# Patient Record
Sex: Female | Born: 1990 | Race: White | Hispanic: No | Marital: Single | Smoking: Never smoker
Health system: Southern US, Community
[De-identification: ages and names within clinical notes are randomized; demographics above are authoritative.]

## PROBLEM LIST (undated history)

## (undated) ENCOUNTER — Inpatient Hospital Stay (HOSPITAL_COMMUNITY): Payer: Self-pay

## (undated) ENCOUNTER — Emergency Department (HOSPITAL_COMMUNITY): Admission: EM | Payer: Medicaid Other | Source: Home / Self Care

## (undated) HISTORY — PX: WISDOM TOOTH EXTRACTION: SHX21

---
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
## 2012-02-10 ENCOUNTER — Emergency Department (HOSPITAL_COMMUNITY)
Admission: EM | Admit: 2012-02-10 | Discharge: 2012-02-11 | Disposition: A | Discharge status: Home / Self Care | Payer: Medicaid Other | Attending: Emergency Medicine | Admitting: Emergency Medicine

## 2012-02-10 ENCOUNTER — Encounter (HOSPITAL_COMMUNITY): Payer: Self-pay | Admitting: Emergency Medicine

## 2012-02-10 DIAGNOSIS — R071 Chest pain on breathing: Secondary | ICD-10-CM | POA: Insufficient documentation

## 2012-02-10 DIAGNOSIS — M940 Chondrocostal junction syndrome [Tietze]: Secondary | ICD-10-CM | POA: Insufficient documentation

## 2012-02-10 DIAGNOSIS — M545 Low back pain, unspecified: Secondary | ICD-10-CM | POA: Insufficient documentation

## 2012-02-10 DIAGNOSIS — R599 Enlarged lymph nodes, unspecified: Secondary | ICD-10-CM | POA: Insufficient documentation

## 2012-02-10 DIAGNOSIS — IMO0001 Reserved for inherently not codable concepts without codable children: Secondary | ICD-10-CM | POA: Insufficient documentation

## 2012-02-10 DIAGNOSIS — J020 Streptococcal pharyngitis: Secondary | ICD-10-CM

## 2012-02-10 LAB — URINALYSIS, ROUTINE W REFLEX MICROSCOPIC
Bilirubin Urine: NEGATIVE
Glucose, UA: NEGATIVE mg/dL
Hgb urine dipstick: NEGATIVE
Ketones, ur: NEGATIVE mg/dL
Leukocytes, UA: NEGATIVE
Nitrite: NEGATIVE
Protein, ur: NEGATIVE mg/dL
Specific Gravity, Urine: 1.025 (ref 1.005–1.030)
Urobilinogen, UA: 1 mg/dL (ref 0.0–1.0)
pH: 6 (ref 5.0–8.0)

## 2012-02-10 LAB — POCT PREGNANCY, URINE: Preg Test, Ur: NEGATIVE

## 2012-02-10 MED ORDER — DEXAMETHASONE SODIUM PHOSPHATE 10 MG/ML IJ SOLN
10.0000 mg | Freq: Once | INTRAMUSCULAR | Status: AC
  Administered 2012-02-10: 10 mg via INTRAMUSCULAR
  Filled 2012-02-10: qty 1

## 2012-02-10 MED ORDER — PENICILLIN G BENZATHINE 1200000 UNIT/2ML IM SUSP
1.2000 10*6.[IU] | Freq: Once | INTRAMUSCULAR | Status: AC
  Administered 2012-02-10: 1.2 10*6.[IU] via INTRAMUSCULAR
  Filled 2012-02-10: qty 2

## 2012-02-10 NOTE — ED Notes (Signed)
Pt c/o sore throat and generalized aches

## 2012-02-10 NOTE — ED Provider Notes (Signed)
History     CSN: [id number]  Arrival date & time 02/10/12  2120   First MD Initiated Contact with Patient 02/10/12 2240      Chief Complaint  Patient presents with  . Sore Throat    (Consider location/radiation/quality/duration/timing/severity/associated sxs/prior treatment) HPI Comments: Patient here with sore throat, generalized aches and chest pain with lower back pain for the past several days - states fever yesterday but none noted today - denies nausea, vomiting, reports pain to bilateral upper chest wall - pain worse with palpation of chest wall - states lower back pain as well.  Patient is a 21 y.o. female presenting with pharyngitis. The history is provided by the patient. No language interpreter was used.  Sore Throat This is a new problem. The current episode started yesterday. The problem occurs constantly. The problem has been unchanged. Associated symptoms include chest pain, a fever, myalgias, a sore throat and swollen glands. Pertinent negatives include no abdominal pain, anorexia, arthralgias, change in bowel habit, chills, congestion, coughing, diaphoresis, fatigue, headaches, joint swelling, nausea, neck pain, numbness, rash, urinary symptoms, vertigo, visual change, vomiting or weakness. The symptoms are aggravated by swallowing. She has tried nothing for the symptoms. The treatment provided no relief.    History reviewed. No pertinent past medical history.  History reviewed. No pertinent past surgical history.  History reviewed. No pertinent family history.  History  Substance Use Topics  . Smoking status: Not on file  . Smokeless tobacco: Not on file  . Alcohol Use: Not on file    OB History    Grav Para Term Preterm Abortions TAB SAB Ect Mult Living                  Review of Systems  Constitutional: Positive for fever. Negative for chills, diaphoresis and fatigue.  HENT: Positive for sore throat. Negative for congestion and neck pain.   Respiratory:  Negative for cough.   Cardiovascular: Positive for chest pain.  Gastrointestinal: Negative for nausea, vomiting, abdominal pain, anorexia and change in bowel habit.  Musculoskeletal: Positive for myalgias. Negative for joint swelling and arthralgias.  Skin: Negative for rash.  Neurological: Negative for vertigo, weakness, numbness and headaches.  All other systems reviewed and are negative.    Allergies  Review of patient's allergies indicates no known allergies.  Home Medications   Current Outpatient Rx  Name Route Sig Dispense Refill  . ACETAMINOPHEN 500 MG PO TABS Oral Take 1,000 mg by mouth every 6 (six) hours as needed. For pain      BP 135/89  Pulse 94  Temp(Src) 98.3 F (36.8 C) (Oral)  Resp 18  SpO2 100%  LMP 01/21/2012  Physical Exam  Nursing note and vitals reviewed. Constitutional: She is oriented to person, place, and time. She appears well-developed and well-nourished. No distress.  HENT:  Head: Normocephalic and atraumatic.  Right Ear: External ear normal.  Left Ear: External ear normal.  Mouth/Throat: Oropharyngeal exudate present.  Eyes: Conjunctivae are normal. Pupils are equal, round, and reactive to light. No scleral icterus.  Neck: Normal range of motion. Neck supple.       Anterior cervical adenopathy  Cardiovascular: Normal rate, regular rhythm and normal heart sounds.  Exam reveals no gallop and no friction rub.   No murmur heard. Pulmonary/Chest: Effort normal and breath sounds normal. No respiratory distress. She exhibits tenderness.    Abdominal: Soft. Bowel sounds are normal. She exhibits no distension. There is no tenderness.  Musculoskeletal: Normal range of  motion. She exhibits no edema and no tenderness.  Lymphadenopathy:    She has cervical adenopathy.  Neurological: She is alert and oriented to person, place, and time. No cranial nerve deficit.  Skin: Skin is warm and dry. No rash noted. No erythema. No pallor.  Psychiatric: She has a  normal mood and affect. Her behavior is normal. Judgment and thought content normal.    ED Course  Procedures (including critical care time)   Labs Reviewed  URINALYSIS, ROUTINE W REFLEX MICROSCOPIC  POCT PREGNANCY, URINE   No results found.   Results for orders placed during the hospital encounter of 02/10/12  URINALYSIS, ROUTINE W REFLEX MICROSCOPIC      Component Value Range   Color, Urine YELLOW  YELLOW    APPearance CLEAR  CLEAR    Specific Gravity, Urine 1.025  1.005 - 1.030    pH 6.0  5.0 - 8.0    Glucose, UA NEGATIVE  NEGATIVE (mg/dL)   Hgb urine dipstick NEGATIVE  NEGATIVE    Bilirubin Urine NEGATIVE  NEGATIVE    Ketones, ur NEGATIVE  NEGATIVE (mg/dL)   Protein, ur NEGATIVE  NEGATIVE (mg/dL)   Urobilinogen, UA 1.0  0.0 - 1.0 (mg/dL)   Nitrite NEGATIVE  NEGATIVE    Leukocytes, UA NEGATIVE  NEGATIVE   POCT PREGNANCY, URINE      Component Value Range   Preg Test, Ur NEGATIVE  NEGATIVE    No results found.  Strep pharyngitis costochondritis    MDM  Patient with CENTOR score of 4 - will treat for strep - also with bilateral costochondral margin pain with palpation - denies pain with insipration, doubt PE or cardiac source given the infection - has a history of periodic chronic lower back pain - this is likely related to this.        Chelsea Price, [place] 02/11/12 0000

## 2012-02-11 MED ORDER — IBUPROFEN 600 MG PO TABS
600.0000 mg | ORAL_TABLET | Freq: Four times a day (QID) | ORAL | Status: AC | PRN
Start: 2012-02-11 — End: 2012-02-21

## 2012-02-11 NOTE — ED Provider Notes (Signed)
Medical screening examination/treatment/procedure(s) were performed by non-physician practitioner and as supervising physician I was immediately available for consultation/collaboration.   Rolan Bucco, MD 02/11/12 [phone]

## 2012-02-11 NOTE — Discharge Instructions (Signed)
Costochondritis Costochondritis is a condition in which the tissue (cartilage) that connects your ribs with your breastbone (sternum) becomes irritated and causes chest pain.  HOME CARE  Avoid activities that wear you out.   Do not strain your ribs. Avoid activities that use your:   Chest.   Belly.   Side muscles.   Put ice on the area.   Put ice in a plastic bag.   Place a towel betwen your skin and the bag.   Leave the ice on for 15 to 20 minutes, 3 to 4 times a day.   Only take medicine as told by your doctor.  GET HELP RIGHT AWAY IF:   Your pain gets worse.   You are very uncomfortable.   You have a fever.   You have trouble breathing.   You cough up blood.   You start sweating or throwing up (vomiting).   You develop new, unexplained symptoms.  MAKE SURE YOU:   Understand these instructions.   Will watch your condition.   Will get help right away if you are not doing well or get worse.  Document Released: 04/24/2008 Document Revised: 10/26/2011 Document Reviewed: 04/24/2008 [facility] Patient Information 2012 [place], [place].Strep Throat Strep throat is an infection of the throat caused by a bacteria named Streptococcus pyogenes. Your caregiver may call the infection streptococcal "tonsillitis" or "pharyngitis" depending on whether there are signs of inflammation in the tonsils or back of the throat. Strep throat is most common in children from 40 to 76 years old during the cold months of the year, but it can occur in people of any age during any season. This infection is spread from person to person (contagious) through coughing, sneezing, or other close contact. SYMPTOMS   Fever or chills.   Painful, swollen, red tonsils or throat.   Pain or difficulty when swallowing.   White or yellow spots on the tonsils or throat.   Swollen, tender lymph nodes or "glands" of the neck or under the jaw.   Red rash all over the body (rare).  DIAGNOSIS  Many different  infections can cause the same symptoms. A test must be done to confirm the diagnosis so the right treatment can be given. A "rapid strep test" can help your caregiver make the diagnosis in a few minutes. If this test is not available, a light swab of the infected area can be used for a throat culture test. If a throat culture test is done, results are usually available in a day or two. TREATMENT  Strep throat is treated with antibiotic medicine. HOME CARE INSTRUCTIONS   Gargle with 1 tsp of salt in 1 cup of warm water, 3 to 4 times per day or as needed for comfort.   Family members who also have a sore throat or fever should be tested for strep throat and treated with antibiotics if they have the strep infection.   Make sure everyone in your household washes their hands well.   Do not share food, drinking cups, or personal items that could cause the infection to spread to others.   You may need to eat a soft food diet until your sore throat gets better.   Drink enough water and fluids to keep your urine clear or pale yellow. This will help prevent dehydration.   Get plenty of rest.   Stay home from school, daycare, or work until you have been on antibiotics for 24 hours.   Only take over-the-counter or prescription medicines  for pain, discomfort, or fever as directed by your caregiver.   If antibiotics are prescribed, take them as directed. Finish them even if you start to feel better.  SEEK MEDICAL CARE IF:   The glands in your neck continue to enlarge.   You develop a rash, cough, or earache.   You cough up green, yellow-brown, or bloody sputum.   You have pain or discomfort not controlled by medicines.   Your problems seem to be getting worse rather than better.  SEEK IMMEDIATE MEDICAL CARE IF:   You develop any new symptoms such as vomiting, severe headache, stiff or painful neck, chest pain, shortness of breath, or trouble swallowing.   You develop severe throat pain,  drooling, or changes in your voice.   You develop swelling of the neck, or the skin on the neck becomes red and tender.   You have a fever.   You develop signs of dehydration, such as fatigue, dry mouth, and decreased urination.   You become increasingly sleepy, or you cannot wake up completely.  Document Released: 11/03/2000 Document Revised: 10/26/2011 Document Reviewed: 01/05/2011 [facility] Patient Information 2012 [place], [place].Salt Water Gargle This solution will help make your mouth and throat feel better. HOME CARE INSTRUCTIONS   Mix 1 teaspoon of salt in 8 ounces of warm water.   Gargle with this solution as much or often as you need or as directed. Swish and gargle gently if you have any sores or wounds in your mouth.   Do not swallow this mixture.  Document Released: 08/10/2004 Document Revised: 10/26/2011 Document Reviewed: 01/01/2009 [facility] Patient Information 2012 [place], [place].

## 2012-03-24 ENCOUNTER — Inpatient Hospital Stay (HOSPITAL_COMMUNITY)
Admission: AD | Admit: 2012-03-24 | Discharge: 2012-03-24 | Disposition: A | Discharge status: Home / Self Care | Payer: Medicaid Other | Source: Ambulatory Visit | Attending: Obstetrics & Gynecology | Admitting: Obstetrics & Gynecology

## 2012-03-24 ENCOUNTER — Encounter (HOSPITAL_COMMUNITY): Payer: Self-pay | Admitting: *Deleted

## 2012-03-24 ENCOUNTER — Inpatient Hospital Stay (HOSPITAL_COMMUNITY): Payer: Medicaid Other

## 2012-03-24 DIAGNOSIS — O468X1 Other antepartum hemorrhage, first trimester: Secondary | ICD-10-CM

## 2012-03-24 DIAGNOSIS — O209 Hemorrhage in early pregnancy, unspecified: Secondary | ICD-10-CM | POA: Insufficient documentation

## 2012-03-24 LAB — WET PREP, GENITAL: Yeast Wet Prep HPF POC: NONE SEEN

## 2012-03-24 LAB — URINE MICROSCOPIC-ADD ON

## 2012-03-24 LAB — CBC
HCT: 36.3 % (ref 36.0–46.0)
MCV: 83.1 fL (ref 78.0–100.0)
Platelets: 298 10*3/uL (ref 150–400)
RBC: 4.37 MIL/uL (ref 3.87–5.11)
RDW: 14.1 % (ref 11.5–15.5)
WBC: 10.1 10*3/uL (ref 4.0–10.5)

## 2012-03-24 LAB — HCG, QUANTITATIVE, PREGNANCY: hCG, Beta Chain, Quant, S: 23630 m[IU]/mL — ABNORMAL HIGH (ref <5)

## 2012-03-24 LAB — DIFFERENTIAL
Basophils Absolute: 0 10*3/uL (ref 0.0–0.1)
Eosinophils Relative: 1 % (ref 0–5)
Lymphocytes Relative: 29 % (ref 12–46)
Lymphs Abs: 2.9 10*3/uL (ref 0.7–4.0)
Monocytes Absolute: 0.7 10*3/uL (ref 0.1–1.0)
Neutro Abs: 6.4 10*3/uL (ref 1.7–7.7)

## 2012-03-24 LAB — URINALYSIS, ROUTINE W REFLEX MICROSCOPIC
Bilirubin Urine: NEGATIVE
Ketones, ur: NEGATIVE mg/dL
Leukocytes, UA: NEGATIVE
Nitrite: NEGATIVE
Protein, ur: NEGATIVE mg/dL
pH: 6 (ref 5.0–8.0)

## 2012-03-24 MED ORDER — RHO D IMMUNE GLOBULIN 1500 UNIT/2ML IJ SOLN
300.0000 ug | Freq: Once | INTRAMUSCULAR | Status: AC
  Administered 2012-03-24: 300 ug via INTRAMUSCULAR
  Filled 2012-03-24: qty 2

## 2012-03-24 NOTE — MAU Provider Note (Signed)
History     CSN: [id number]  Arrival date & time 03/24/12  1859   None     Chief Complaint  Patient presents with  . Vaginal Bleeding    HPI Chelsea Price is a 21 y.o. female who presents to MAU for bleeding in early pregnancy. Went to the health department on April 24th and had positive pregnancy test. LMP 02/11/12. Bleeding started 2 days ago as spotting and then stopped. Today began bleeding again also had low abdominal pain today. The history was provided by the patient.  History reviewed. No pertinent past medical history.  Past Surgical History  Procedure Date  . Wisdom tooth extraction     History reviewed. No pertinent family history.  History  Substance Use Topics  . Smoking status: Never Smoker   . Smokeless tobacco: Not on file  . Alcohol Use: No    OB History    Grav Para Term Preterm Abortions TAB SAB Ect Mult Living   1               Review of Systems  Constitutional: Negative for fever, chills, diaphoresis and fatigue.  HENT: Negative for ear pain, congestion, sore throat, facial swelling, neck pain, neck stiffness, dental problem and sinus pressure.   Eyes: Negative for photophobia, pain and discharge.  Respiratory: Negative for cough, chest tightness and wheezing.   Cardiovascular: Negative for chest pain and palpitations.  Gastrointestinal: Negative for nausea, vomiting, abdominal pain, diarrhea, constipation and abdominal distention.  Genitourinary: Positive for frequency and vaginal bleeding. Negative for dysuria, urgency, flank pain, vaginal discharge and difficulty urinating.  Musculoskeletal: Negative for myalgias, back pain and gait problem.  Skin: Negative for color change and rash.  Neurological: Negative for dizziness, speech difficulty, weakness, light-headedness, numbness and headaches.  Psychiatric/Behavioral: Negative for confusion and agitation. The patient is not nervous/anxious.     Allergies  Latex  Home Medications  No current  outpatient prescriptions on file.  BP 134/69  Pulse 105  Temp(Src) 98.2 F (36.8 C) (Oral)  Resp 20  Ht 5\' 2"  (1.575 m)  Wt 156 lb (70.761 kg)  BMI 28.53 kg/m2  SpO2 98%  LMP 02/11/2012  Physical Exam  Nursing note and vitals reviewed. Constitutional: She is oriented to person, place, and time. She appears well-developed and well-nourished. No distress.  HENT:  Head: Normocephalic.  Eyes: EOM are normal.  Neck: Neck supple.  Cardiovascular: Normal rate.   Pulmonary/Chest: Effort normal.  Genitourinary: Uterus normal. Vaginal discharge (small amount of blood in vault, cervix closed/long, Uterus small, adnexae nontender) found.  Musculoskeletal: Normal range of motion.  Neurological: She is alert and oriented to person, place, and time. No cranial nerve deficit.  Skin: Skin is warm and dry.  Psychiatric: She has a normal mood and affect. Her behavior is normal. Judgment and thought content normal.   Results for orders placed during the hospital encounter of 03/24/12 (from the past 24 hour(s))  URINALYSIS, ROUTINE W REFLEX MICROSCOPIC     Status: Abnormal   Collection Time   03/24/12  7:53 PM      Component Value Range   Color, Urine YELLOW  YELLOW    APPearance HAZY (*) CLEAR    Specific Gravity, Urine >1.030 (*) 1.005 - 1.030    pH 6.0  5.0 - 8.0    Glucose, UA NEGATIVE  NEGATIVE (mg/dL)   Hgb urine dipstick MODERATE (*) NEGATIVE    Bilirubin Urine NEGATIVE  NEGATIVE    Ketones, ur NEGATIVE  NEGATIVE (mg/dL)   Protein, ur NEGATIVE  NEGATIVE (mg/dL)   Urobilinogen, UA 0.2  0.0 - 1.0 (mg/dL)   Nitrite NEGATIVE  NEGATIVE    Leukocytes, UA NEGATIVE  NEGATIVE   URINE MICROSCOPIC-ADD ON     Status: Abnormal   Collection Time   03/24/12  7:53 PM      Component Value Range   Squamous Epithelial / LPF FEW (*) RARE    WBC, UA 0-2  <3 (WBC/hpf)   RBC / HPF 0-2  <3 (RBC/hpf)   Bacteria, UA RARE  RARE   CBC     Status: Abnormal   Collection Time   03/24/12  8:08 PM      Component  Value Range   WBC 10.1  4.0 - 10.5 (K/uL)   RBC 4.37  3.87 - 5.11 (MIL/uL)   Hemoglobin 11.6 (*) 12.0 - 15.0 (g/dL)   HCT 40.9  81.1 - 91.4 (%)   MCV 83.1  78.0 - 100.0 (fL)   MCH 26.5  26.0 - 34.0 (pg)   MCHC 32.0  30.0 - 36.0 (g/dL)   RDW 78.2  95.6 - 21.3 (%)   Platelets 298  150 - 400 (K/uL)  DIFFERENTIAL     Status: Normal   Collection Time   03/24/12  8:08 PM      Component Value Range   Neutrophils Relative 63  43 - 77 (%)   Neutro Abs 6.4  1.7 - 7.7 (K/uL)   Lymphocytes Relative 29  12 - 46 (%)   Lymphs Abs 2.9  0.7 - 4.0 (K/uL)   Monocytes Relative 7  3 - 12 (%)   Monocytes Absolute 0.7  0.1 - 1.0 (K/uL)   Eosinophils Relative 1  0 - 5 (%)   Eosinophils Absolute 0.1  0.0 - 0.7 (K/uL)   Basophils Relative 0  0 - 1 (%)   Basophils Absolute 0.0  0.0 - 0.1 (K/uL)  ABO/RH     Status: Normal   Collection Time   03/24/12  8:08 PM      Component Value Range   ABO/RH(D) A NEG    HCG, QUANTITATIVE, PREGNANCY     Status: Abnormal   Collection Time   03/24/12  8:30 PM      Component Value Range   hCG, Beta Chain, Quant, S 23630 (*) <5 (mIU/mL)   Patient in ultrasound: Care turned over to Wynelle Bourgeois, CNM @ 21:20pm  [place]:  SIUP at 6.0 weeks, HR 90, Left CLC, No free fluid US Ob Comp Less 14 Wks  03/24/2012  *RADIOLOGY REPORT*  Clinical Data: Vaginal bleeding with positive pregnancy test.  OBSTETRIC <14 WK [place] AND TRANSVAGINAL OB US  Technique:  Both transabdominal and transvaginal ultrasound examinations were performed for complete evaluation of the gestation as well as the maternal uterus, adnexal regions, and pelvic cul-de-sac.  Transvaginal technique was performed to assess early pregnancy.  Comparison:  None.  Intrauterine gestational sac:  Single intrauterine gestational sac visualized. Yolk sac: Visualized Embryo: Visualized Cardiac Activity: Visualized Heart Rate: 90 bpm  CRL: 2.8   mm  6   w  0   d         [place] EDC: 11/17/2012  Maternal uterus/adnexae: Small to moderate subchorionic  hemorrhage is identified.  Maternal ovaries are unremarkable with corpus luteum cyst seen on the left. No free fluid in the cul-de-sac.  IMPRESSION: Single living intrauterine gestation at 6-week-0-day estimated gestational age.  A small to moderate subchorionic hemorrhage is  associated.  Original Report Authenticated By: ERIC A. MANSELL, M.D.    ED Course  Procedures   MDM  A:  SIUP at 6.0 weeks      First Trimester bleeding  P:  Discharge home       Pelvic rest until no bleeding       Begin Prenatal care

## 2012-03-24 NOTE — Progress Notes (Signed)
SSE per CNM.  Wet prep and cultures collected. Ve done.

## 2012-03-24 NOTE — Discharge Instructions (Signed)
Vaginal Bleeding During Pregnancy, First Trimester  A small amount of bleeding (spotting) is relatively common in early pregnancy. It usually stops on its own. There are many causes for bleeding or spotting in early pregnancy. Some bleeding may be related to the pregnancy and some may not. Cramping with the bleeding is more serious and concerning. Tell your caregiver if you have any vaginal bleeding.   CAUSES    It is normal in most cases.   The pregnancy ends (miscarriage).   The pregnancy may end (threatened miscarriage).   Infection or inflammation of the cervix.   Growths (polyps) on the cervix.   Pregnancy happens outside of the uterus and in a fallopian tube (tubal pregnancy).   Many tiny cysts in the uterus instead of pregnancy tissue (molar pregnancy).  SYMPTOMS   Vaginal bleeding or spotting with or without cramps.  DIAGNOSIS   To evaluate the pregnancy, your caregiver may:   Do a pelvic exam.   Take blood tests.   Do an ultrasound.  It is very important to follow your caregiver's instructions.   TREATMENT    Evaluation of the pregnancy with blood tests and ultrasound.   Bed rest (getting up to use the bathroom only).   Rho-gam immunization if the mother is Rh negative and the father is Rh positive.  HOME CARE INSTRUCTIONS    If your caregiver orders bed rest, you may need to make arrangements for the care of other children and for other responsibilities. However, your caregiver may allow you to continue light activity.   Keep track of the number of pads you use each day, how often you change pads and how soaked (saturated) they are. Write this down.   Do not use tampons. Do not douche.   Do not have sexual intercourse or orgasms until approved by your physician.   Save any tissue that you pass for your caregiver to see.   Take medicine for cramps only with your caregiver's permission.   Do not take aspirin because it can make you bleed.  SEEK IMMEDIATE MEDICAL CARE IF:    You  experience severe cramps in your stomach, back or belly (abdomen).   You have an oral temperature above 102 F (38.9 C), not controlled by medicine.   You pass large clots or tissue.   Your bleeding increases or you become light-headed, weak or have fainting episodes.   You develop chills.   You are leaking or have a gush of fluid from your vagina.   You pass out while having a bowel movement. That may mean you have a ruptured tubal pregnancy.  Document Released: 08/16/2005 Document Revised: 10/26/2011 Document Reviewed: 02/25/2009  [employer] Patient Information 2012 [employer].

## 2012-03-24 NOTE — MAU Note (Signed)
Artelia Laroche, CNM at bedside.  Assessment done and poc discussed with pt.

## 2012-03-24 NOTE — MAU Note (Signed)
Pt sttes she had a pinkish red discharge a couple days ago-sttes it started again today around 1700-no cramping at present

## 2012-03-25 LAB — RH IG WORKUP (INCLUDES ABO/RH): Unit division: 0

## 2012-03-25 LAB — POCT PREGNANCY, URINE: Preg Test, Ur: POSITIVE — AB

## 2012-04-10 LAB — OB RESULTS CONSOLE HIV ANTIBODY (ROUTINE TESTING): HIV: REACTIVE

## 2012-11-13 ENCOUNTER — Encounter (HOSPITAL_COMMUNITY): Payer: Self-pay | Admitting: *Deleted

## 2012-11-13 ENCOUNTER — Inpatient Hospital Stay (HOSPITAL_COMMUNITY)
Admission: AD | Admit: 2012-11-13 | Discharge: 2012-11-15 | DRG: 775 | Disposition: A | Discharge status: Home / Self Care | Payer: Medicaid Other | Source: Ambulatory Visit | Attending: Obstetrics & Gynecology | Admitting: Obstetrics & Gynecology

## 2012-11-13 DIAGNOSIS — O99892 Other specified diseases and conditions complicating childbirth: Principal | ICD-10-CM | POA: Diagnosis present

## 2012-11-13 DIAGNOSIS — Z22330 Carrier of Group B streptococcus: Secondary | ICD-10-CM

## 2012-11-13 LAB — CBC
HCT: 34 % — ABNORMAL LOW (ref 36.0–46.0)
MCH: 25.5 pg — ABNORMAL LOW (ref 26.0–34.0)
MCV: 78.9 fL (ref 78.0–100.0)
RDW: 14.2 % (ref 11.5–15.5)
WBC: 17.5 10*3/uL — ABNORMAL HIGH (ref 4.0–10.5)

## 2012-11-13 MED ORDER — LIDOCAINE HCL (PF) 1 % IJ SOLN: 30.0000 mL | INTRAMUSCULAR | Status: DC | PRN

## 2012-11-13 MED ORDER — OXYTOCIN 40 UNITS IN LACTATED RINGERS INFUSION - SIMPLE MED
62.5000 mL/h | INTRAVENOUS | Status: DC
  Filled 2012-11-13: qty 1000

## 2012-11-13 MED ORDER — ONDANSETRON HCL 4 MG/2ML IJ SOLN: 4.0000 mg | Freq: Four times a day (QID) | INTRAMUSCULAR | Status: DC | PRN

## 2012-11-13 MED ORDER — BUTORPHANOL TARTRATE 1 MG/ML IJ SOLN
INTRAMUSCULAR | Status: AC
  Filled 2012-11-13: qty 1

## 2012-11-13 MED ORDER — SIMETHICONE 80 MG PO CHEW: 80.0000 mg | CHEWABLE_TABLET | ORAL | Status: DC | PRN

## 2012-11-13 MED ORDER — SENNOSIDES-DOCUSATE SODIUM 8.6-50 MG PO TABS
2.0000 | ORAL_TABLET | Freq: Every day | ORAL | Status: DC
  Administered 2012-11-13: 2 via ORAL

## 2012-11-13 MED ORDER — CITRIC ACID-SODIUM CITRATE 334-500 MG/5ML PO SOLN: 30.0000 mL | ORAL | Status: DC | PRN

## 2012-11-13 MED ORDER — ZOLPIDEM TARTRATE 5 MG PO TABS: 5.0000 mg | ORAL_TABLET | Freq: Every evening | ORAL | Status: DC | PRN

## 2012-11-13 MED ORDER — OXYTOCIN BOLUS FROM INFUSION: 500.0000 mL | INTRAVENOUS | Status: DC

## 2012-11-13 MED ORDER — ONDANSETRON HCL 4 MG PO TABS: 4.0000 mg | ORAL_TABLET | ORAL | Status: DC | PRN

## 2012-11-13 MED ORDER — ONDANSETRON HCL 4 MG/2ML IJ SOLN: 4.0000 mg | INTRAMUSCULAR | Status: DC | PRN

## 2012-11-13 MED ORDER — TETANUS-DIPHTH-ACELL PERTUSSIS 5-2.5-18.5 LF-MCG/0.5 IM SUSP: 0.5000 mL | Freq: Once | INTRAMUSCULAR | Status: DC

## 2012-11-13 MED ORDER — OXYCODONE-ACETAMINOPHEN 5-325 MG PO TABS: 1.0000 | ORAL_TABLET | ORAL | Status: DC | PRN

## 2012-11-13 MED ORDER — LACTATED RINGERS IV SOLN: 500.0000 mL | INTRAVENOUS | Status: DC | PRN

## 2012-11-13 MED ORDER — LACTATED RINGERS IV SOLN: INTRAVENOUS | Status: DC

## 2012-11-13 MED ORDER — OXYTOCIN BOLUS FROM INFUSION
500.0000 mL | INTRAVENOUS | Status: DC
  Administered 2012-11-13: 500 mL via INTRAVENOUS

## 2012-11-13 MED ORDER — FLEET ENEMA 7-19 GM/118ML RE ENEM: 1.0000 | ENEMA | RECTAL | Status: DC | PRN

## 2012-11-13 MED ORDER — BENZOCAINE-MENTHOL 20-0.5 % EX AERO
1.0000 "application " | INHALATION_SPRAY | CUTANEOUS | Status: DC | PRN
  Administered 2012-11-13: 1 via TOPICAL
  Filled 2012-11-13: qty 56

## 2012-11-13 MED ORDER — IBUPROFEN 600 MG PO TABS: 600.0000 mg | ORAL_TABLET | Freq: Four times a day (QID) | ORAL | Status: DC | PRN

## 2012-11-13 MED ORDER — ACETAMINOPHEN 325 MG PO TABS: 650.0000 mg | ORAL_TABLET | ORAL | Status: DC | PRN

## 2012-11-13 MED ORDER — LANOLIN HYDROUS EX OINT: TOPICAL_OINTMENT | CUTANEOUS | Status: DC | PRN

## 2012-11-13 MED ORDER — OXYCODONE-ACETAMINOPHEN 5-325 MG PO TABS
1.0000 | ORAL_TABLET | ORAL | Status: DC | PRN
Start: 2012-11-13 — End: 2012-11-15

## 2012-11-13 MED ORDER — DIPHENHYDRAMINE HCL 25 MG PO CAPS: 25.0000 mg | ORAL_CAPSULE | Freq: Four times a day (QID) | ORAL | Status: DC | PRN

## 2012-11-13 MED ORDER — IBUPROFEN 600 MG PO TABS
600.0000 mg | ORAL_TABLET | Freq: Four times a day (QID) | ORAL | Status: DC
  Administered 2012-11-13 – 2012-11-15 (×8): 600 mg via ORAL
  Filled 2012-11-13 (×8): qty 1

## 2012-11-13 MED ORDER — PRENATAL MULTIVITAMIN CH
1.0000 | ORAL_TABLET | Freq: Every day | ORAL | Status: DC
  Administered 2012-11-15: 1 via ORAL
  Filled 2012-11-13 (×2): qty 1

## 2012-11-13 MED ORDER — DIBUCAINE 1 % RE OINT: 1.0000 "application " | TOPICAL_OINTMENT | RECTAL | Status: DC | PRN

## 2012-11-13 MED ORDER — BUTORPHANOL TARTRATE 1 MG/ML IJ SOLN
1.0000 mg | INTRAMUSCULAR | Status: DC | PRN
  Administered 2012-11-13: 1 mg via INTRAVENOUS

## 2012-11-13 MED ORDER — OXYTOCIN 40 UNITS IN LACTATED RINGERS INFUSION - SIMPLE MED: 62.5000 mL/h | INTRAVENOUS | Status: DC

## 2012-11-13 MED ORDER — WITCH HAZEL-GLYCERIN EX PADS: 1.0000 "application " | MEDICATED_PAD | CUTANEOUS | Status: DC | PRN

## 2012-11-13 MED ORDER — LIDOCAINE HCL (PF) 1 % IJ SOLN
30.0000 mL | INTRAMUSCULAR | Status: DC | PRN
  Filled 2012-11-13: qty 30

## 2012-11-13 MED ORDER — SODIUM CHLORIDE 0.9 % IV SOLN: 2.0000 g | Freq: Once | INTRAVENOUS | Status: DC

## 2012-11-13 MED ORDER — SODIUM CHLORIDE 0.9 % IV SOLN
2.0000 g | Freq: Once | INTRAVENOUS | Status: AC
  Administered 2012-11-13: 2 g via INTRAVENOUS
  Filled 2012-11-13: qty 2000

## 2012-11-13 NOTE — Progress Notes (Signed)
Dr Arlyce Dice notified of patient, will enter orders to admit.

## 2012-11-13 NOTE — H&P (Addendum)
21 y.o. G1P0  Estimated Date of Delivery: 11/23/12 admitted at 38/[redacted] weeks gestation in active labor.  Prenatal Transfer Tool  Maternal Diabetes: No Genetic Screening: Declined Maternal Ultrasounds/Referrals: Normal Fetal Ultrasounds or other Referrals:  None Maternal Substance Abuse:  No Significant Maternal Medications:  None Significant Maternal Lab Results: Lab values include: Rh negative, positive GBS Other Significant Pregnancy Complications:  None  Afebrile, VSS Heart and Lungs: No active disease Abdomen: soft, gravid, EFW AGA. Cervical exam:  9 cm  Impression: Term pregnancy in active labor  Plan:  TOL, IV Ampicillin for GBS prophylaxis.

## 2012-11-14 LAB — CBC
Hemoglobin: 9.3 g/dL — ABNORMAL LOW (ref 12.0–15.0)
MCH: 25.6 pg — ABNORMAL LOW (ref 26.0–34.0)
MCHC: 32.4 g/dL (ref 30.0–36.0)
RDW: 14.4 % (ref 11.5–15.5)

## 2012-11-14 MED ORDER — RHO D IMMUNE GLOBULIN 1500 UNIT/2ML IJ SOLN
300.0000 ug | Freq: Once | INTRAMUSCULAR | Status: AC
  Administered 2012-11-14: 300 ug via INTRAMUSCULAR
  Filled 2012-11-14: qty 2

## 2012-11-14 NOTE — Progress Notes (Signed)
CSW consult noted. Pt's bedside RN told CSW that pt is bonding well, attentive to infants needs & providing appropriate care. Please reconsult if CSW services needed. CSW signing off.  

## 2012-11-14 NOTE — Progress Notes (Signed)
UR completed 

## 2012-11-14 NOTE — Progress Notes (Signed)
Post Partum Day 1 Subjective: no complaints, up ad lib, voiding and tolerating PO  Objective: Blood pressure 103/63, pulse 93, temperature 98.4 F (36.9 C), temperature source Oral, resp. rate 18, height 5\' 2"  (1.575 m), weight 80.74 kg (178 lb), last menstrual period 02/11/2012, SpO2 96.00%, unknown if currently breastfeeding.  Physical Exam:  General: alert, cooperative and appears stated age Lochia: appropriate Uterine Fundus: firm   Basename 11/14/12 0525 11/13/12 1425  HGB 9.3* 11.0*  HCT 28.7* 34.0*    Assessment/Plan: Breastfeeding and Lactation consult Circ in office   LOS: 1 day   Deon Duer H. 11/14/2012, 10:21 AM

## 2012-11-15 ENCOUNTER — Encounter (HOSPITAL_COMMUNITY): Payer: Self-pay | Admitting: *Deleted

## 2012-11-15 LAB — RH IG WORKUP (INCLUDES ABO/RH)
Gestational Age(Wks): 38
Unit division: 0

## 2012-11-15 MED ORDER — PRENATAL MULTIVITAMIN CH: 1.0000 | ORAL_TABLET | Freq: Every day | ORAL | Status: DC

## 2012-11-15 MED ORDER — IBUPROFEN 600 MG PO TABS: 600.0000 mg | ORAL_TABLET | Freq: Four times a day (QID) | ORAL | Status: DC

## 2012-11-15 NOTE — Discharge Summary (Signed)
Obstetric Discharge Summary Reason for Admission: onset of labor Prenatal Procedures: none Intrapartum Procedures: spontaneous vaginal delivery and episiotomy -median Postpartum Procedures: Rho(D) Ig Complications-Operative and Postpartum: none Hemoglobin  Date Value Range Status  11/14/2012 9.3* 12.0 - 15.0 g/dL Final     HCT  Date Value Range Status  11/14/2012 28.7* 36.0 - 46.0 % Final    Physical Exam:  General: alert and cooperative Lochia: appropriate Uterine Fundus: firm DVT Evaluation: No evidence of DVT seen on physical exam.  Discharge Diagnoses: Term Pregnancy-delivered  Discharge Information: Date: 11/15/2012 Activity: pelvic rest Diet: routine Medications: PNV, Ibuprofen and Iron Condition: stable Instructions: refer to practice specific booklet Discharge to: home Follow-up Information    Follow up with Mickel Baas, MD. In 4 weeks.   Contact information:   [address] [place] [address] [phone]          Newborn Data: Live born female  Birth Weight: 7 lb 10.9 oz (3485 g) APGAR: 9, 9  Home with mother.  Philip Aspen 11/15/2012, 9:41 AM

## 2013-11-20 NOTE — L&D Delivery Note (Signed)
Delivery Note At 5:33 PM a viable and healthy female was delivered via Vaginal, Spontaneous Delivery (Presentation: Left Occiput Anterior) with 1 min, 30 sec. shoulder dystocia released using  McRoberts maneuver and suprapubic pressure.  APGAR: see NICU notes; weight - pending.   Placenta status: Intact; Tomasa BlaseSchultz, spontaneous.  Cord: 3 vessels with the following complications: nuchal x 2.    Anesthesia: Epidural  Episiotomy: None Lacerations: none  Suture Repair: none Est. Blood Loss (mL): 250 Mom and baby doing well, bonding.  Baby pink with stable respirations. FOB at bedside. Mom to postpartum.  Baby to Couplet care / Skin to Skin.  Carlise Stofer SHWON 10/24/2014, 5:53 PM

## 2013-12-12 ENCOUNTER — Emergency Department (HOSPITAL_COMMUNITY)
Admission: EM | Admit: 2013-12-12 | Discharge: 2013-12-12 | Disposition: A | Discharge status: Home / Self Care | Payer: Medicaid Other | Attending: Emergency Medicine | Admitting: Emergency Medicine

## 2013-12-12 ENCOUNTER — Encounter (HOSPITAL_COMMUNITY): Payer: Self-pay | Admitting: Emergency Medicine

## 2013-12-12 DIAGNOSIS — H6990 Unspecified Eustachian tube disorder, unspecified ear: Secondary | ICD-10-CM | POA: Insufficient documentation

## 2013-12-12 DIAGNOSIS — H6980 Other specified disorders of Eustachian tube, unspecified ear: Secondary | ICD-10-CM

## 2013-12-12 DIAGNOSIS — R51 Headache: Secondary | ICD-10-CM | POA: Insufficient documentation

## 2013-12-12 DIAGNOSIS — Z91040 Latex allergy status: Secondary | ICD-10-CM | POA: Insufficient documentation

## 2013-12-12 MED ORDER — MECLIZINE HCL 25 MG PO TABS: 25.0000 mg | ORAL_TABLET | Freq: Three times a day (TID) | ORAL | Status: DC | PRN

## 2013-12-12 MED ORDER — NAPROXEN 375 MG PO TABS: 375.0000 mg | ORAL_TABLET | Freq: Two times a day (BID) | ORAL | Status: DC

## 2013-12-12 MED ORDER — OXYMETAZOLINE HCL 0.05 % NA SOLN: NASAL | Status: DC

## 2013-12-12 NOTE — ED Notes (Signed)
Patient c/o bilateral ear pain. No fever at this time.

## 2013-12-12 NOTE — ED Provider Notes (Signed)
Medical screening examination/treatment/procedure(s) were performed by non-physician practitioner and as supervising physician I was immediately available for consultation/collaboration.  EKG Interpretation   None         Enid SkeensJoshua M Ahyan Kreeger, MD 12/12/13 [phone]

## 2013-12-12 NOTE — ED Provider Notes (Signed)
CSN: [id number]     Arrival date & time 12/12/13  1021 History  This chart was scribed for non-physician practitioner, Arthor CaptainAbigail Cache Decoursey, PA-C working with Enid SkeensJoshua M Zavitz, MD by Greggory StallionKayla Andersen, ED scribe. This patient was seen in room WTR8/WTR8 and the patient's care was started at 11:52 AM.   Chief Complaint  Patient presents with  . Otalgia   The history is provided by the patient. No language interpreter was used.   HPI Comments: Chelsea Price is a 23 y.o. female who presents to the Emergency Department complaining of gradual onset, constant bilateral otalgia that started 4-5 days ago. States the pain causes a headache and dizziness. Pt denies history of ear infections. She has taken 200 mg Tylenol with no relief. Denies fever, fatigue, congestion, sinus pressure, sore throat, cough, rash. Denies recent pressure changes.   History reviewed. No pertinent past medical history. Past Surgical History  Procedure Laterality Date  . Wisdom tooth extraction    . Wisdom tooth extraction     History reviewed. No pertinent family history. History  Substance Use Topics  . Smoking status: Never Smoker   . Smokeless tobacco: Not on file  . Alcohol Use: No   OB History   Grav Para Term Preterm Abortions TAB SAB Ect Mult Living   1 1 1       1      Review of Systems  Constitutional: Negative for fever and fatigue.  HENT: Positive for ear pain. Negative for congestion, sinus pressure and sore throat.   Respiratory: Negative for cough.   Skin: Negative for rash.  Neurological: Positive for dizziness and headaches.    Allergies  Latex  Home Medications  No current outpatient prescriptions on file.  BP 129/73  Pulse 90  Temp(Src) 98.5 F (36.9 C) (Oral)  Resp 17  SpO2 96%  LMP 11/05/2013  Physical Exam  Nursing note and vitals reviewed. Constitutional: She is oriented to person, place, and time. She appears well-developed and well-nourished. No distress.  HENT:  Head:  Normocephalic and atraumatic.  Right Ear: Tympanic membrane and ear canal normal. Tympanic membrane is not erythematous, not retracted and not bulging.  Left Ear: Tympanic membrane and ear canal normal. Tympanic membrane is not erythematous, not retracted and not bulging.  Tragal tenderness on the left and right sides.   Eyes: EOM are normal.  Neck: Neck supple. No tracheal deviation present.  Cardiovascular: Normal rate.   Pulmonary/Chest: Effort normal. No respiratory distress.  Musculoskeletal: Normal range of motion.  Neurological: She is alert and oriented to person, place, and time.  Skin: Skin is warm and dry.  Psychiatric: She has a normal mood and affect. Her behavior is normal.    ED Course  Procedures (including critical care time)  DIAGNOSTIC STUDIES: Oxygen Saturation is 96% on RA, normal by my interpretation.    COORDINATION OF CARE: 11:57 AM-Discussed treatment plan which includes symptomatic treatment with pt at bedside and pt agreed to plan. Advised pt to follow up with ENT if symptoms do not resolve.  Labs Review Labs Reviewed - No data to display Imaging Review No results found.  EKG Interpretation   None       MDM   1. Eustachian tube dysfunction    Patient with eustachian tube dysfunction, d/c with afrin, meclizine, and aleve. F/u with ent fir sxs worsen. no signs of otitis media or otitis externa, no concern for meningitis.  No cervical lymphadenopathy.  No mastoid tenderness the  I personally  performed the services described in this documentation, which was scribed in my presence. The recorded information has been reviewed and is accurate.      Arthor Captain, PA-C 12/12/13 1205

## 2013-12-12 NOTE — Discharge Instructions (Signed)
Barotitis Media  Barotitis media is inflammation of your middle ear. This occurs when the auditory tube (eustachian tube) leading from the back of your nose (nasopharynx) to your eardrum is blocked. This blockage may result from a cold, environmental allergies, or an upper respiratory infection. Unresolved barotitis media may lead to damage or hearing loss (barotrauma), which may become permanent.  HOME CARE INSTRUCTIONS   · Use medicines as recommended by your health care provider. Over-the-counter medicines will help unblock the canal and can help during times of air travel.  · Do not put anything into your ears to clean or unplug them. Eardrops will not be helpful.  · Do not swim, dive, or fly until your health care provider says it is all right to do so. If these activities are necessary, chewing gum with frequent, forceful swallowing may help. It is also helpful to hold your nose and gently blow to pop your ears for equalizing pressure changes. This forces air into the eustachian tube.  · Only take over-the-counter or prescription medicines for pain, discomfort, or fever as directed by your health care provider.  · A decongestant may be helpful in decongesting the middle ear and make pressure equalization easier.  SEEK MEDICAL CARE IF:  · You experience a serious form of dizziness in which you feel as if the room is spinning and you feel nauseated (vertigo).  · Your symptoms only involve one ear.  SEEK IMMEDIATE MEDICAL CARE IF:   · You develop a severe headache, dizziness, or severe ear pain.  · You have bloody or pus-like drainage from your ears.  · You develop a fever.  · Your problems do not improve or become worse.  MAKE SURE YOU:   · Understand these instructions.  · Will watch your condition.  · Will get help right away if you are not doing well or get worse.  Document Released: 11/03/2000 Document Revised: 08/27/2013 Document Reviewed: 06/03/2013  [employer]® Patient Information ©2014 [employer].

## 2014-05-25 ENCOUNTER — Other Ambulatory Visit: Payer: Self-pay | Admitting: Obstetrics and Gynecology

## 2014-05-25 DIAGNOSIS — O3680X0 Pregnancy with inconclusive fetal viability, not applicable or unspecified: Secondary | ICD-10-CM

## 2014-06-01 ENCOUNTER — Encounter: Payer: Self-pay | Admitting: Obstetrics and Gynecology

## 2014-06-01 ENCOUNTER — Other Ambulatory Visit: Payer: Self-pay | Admitting: Obstetrics and Gynecology

## 2014-06-01 ENCOUNTER — Ambulatory Visit (INDEPENDENT_AMBULATORY_CARE_PROVIDER_SITE_OTHER): Payer: Medicaid Other

## 2014-06-01 DIAGNOSIS — O0932 Supervision of pregnancy with insufficient antenatal care, second trimester: Secondary | ICD-10-CM

## 2014-06-01 DIAGNOSIS — O0930 Supervision of pregnancy with insufficient antenatal care, unspecified trimester: Secondary | ICD-10-CM

## 2014-06-01 DIAGNOSIS — O3680X0 Pregnancy with inconclusive fetal viability, not applicable or unspecified: Secondary | ICD-10-CM

## 2014-06-01 NOTE — Progress Notes (Signed)
U/S(18+1wks)-active fetus, meas c/w LMP dates, cx appears closed (3.3cm), FHR-127bpm, bilateral adnexa appears WNL, anterior Gr 0 placenta, no major abnl noted, female fetus

## 2014-06-11 ENCOUNTER — Ambulatory Visit (INDEPENDENT_AMBULATORY_CARE_PROVIDER_SITE_OTHER): Payer: Medicaid Other | Admitting: Advanced Practice Midwife

## 2014-06-11 ENCOUNTER — Other Ambulatory Visit (HOSPITAL_COMMUNITY)
Admission: RE | Admit: 2014-06-11 | Discharge: 2014-06-11 | Disposition: A | Discharge status: Home / Self Care | Payer: Medicaid Other | Source: Ambulatory Visit | Attending: Advanced Practice Midwife | Admitting: Advanced Practice Midwife

## 2014-06-11 ENCOUNTER — Encounter: Payer: Self-pay | Admitting: Advanced Practice Midwife

## 2014-06-11 VITALS — BP 118/70 | Wt 159.5 lb

## 2014-06-11 DIAGNOSIS — Z3482 Encounter for supervision of other normal pregnancy, second trimester: Secondary | ICD-10-CM

## 2014-06-11 DIAGNOSIS — O0930 Supervision of pregnancy with insufficient antenatal care, unspecified trimester: Secondary | ICD-10-CM | POA: Insufficient documentation

## 2014-06-11 DIAGNOSIS — Z3480 Encounter for supervision of other normal pregnancy, unspecified trimester: Secondary | ICD-10-CM

## 2014-06-11 DIAGNOSIS — Z01419 Encounter for gynecological examination (general) (routine) without abnormal findings: Secondary | ICD-10-CM | POA: Diagnosis present

## 2014-06-11 DIAGNOSIS — Z113 Encounter for screening for infections with a predominantly sexual mode of transmission: Secondary | ICD-10-CM | POA: Insufficient documentation

## 2014-06-11 DIAGNOSIS — Z331 Pregnant state, incidental: Secondary | ICD-10-CM

## 2014-06-11 DIAGNOSIS — O0932 Supervision of pregnancy with insufficient antenatal care, second trimester: Secondary | ICD-10-CM

## 2014-06-11 DIAGNOSIS — Z1389 Encounter for screening for other disorder: Secondary | ICD-10-CM

## 2014-06-11 LAB — CBC
HEMATOCRIT: 34.2 % — AB (ref 36.0–46.0)
Hemoglobin: 11.8 g/dL — ABNORMAL LOW (ref 12.0–15.0)
MCH: 27.4 pg (ref 26.0–34.0)
MCHC: 34.5 g/dL (ref 30.0–36.0)
MCV: 79.5 fL (ref 78.0–100.0)
PLATELETS: 318 10*3/uL (ref 150–400)
RBC: 4.3 MIL/uL (ref 3.87–5.11)
RDW: 14.7 % (ref 11.5–15.5)
WBC: 11.3 10*3/uL — AB (ref 4.0–10.5)

## 2014-06-11 LAB — POCT URINALYSIS DIPSTICK
Glucose, UA: NEGATIVE
Ketones, UA: NEGATIVE
LEUKOCYTES UA: NEGATIVE
NITRITE UA: NEGATIVE
PROTEIN UA: NEGATIVE
RBC UA: NEGATIVE

## 2014-06-11 NOTE — Progress Notes (Signed)
  Subjective:    Chelsea Price is a G2P1001 5814w4d being seen today for her first obstetrical visit.  Her obstetrical history is significant for Late prenatal care at 18 weeks.  Pregnancy history fully reviewed.  Patient reports no complaints  Not very excited about pregnancy, it's a big adjustment.  Not considering adoption, and FOB is here and supportive. Declines counseling: "I'll get used to it'.  Filed Vitals:   06/11/14 1518  BP: 118/70  Weight: 159 lb 8 oz (72.349 kg)    HISTORY: OB History  Gravida Para Term Preterm AB SAB TAB Ectopic Multiple Living  2 1 1       1     # Outcome Date GA Lbr Len/2nd Weight Sex Delivery Anes PTL Lv  2 CUR           1 TRM 11/13/12 4453w4d 03:14 / 00:28 7 lb 10.9 oz (3.485 kg) M SVD Pud  Y     Comments: WNL     History reviewed. No pertinent past medical history. Past Surgical History  Procedure Laterality Date  . Wisdom tooth extraction    . Wisdom tooth extraction     Family History  Problem Relation Age of Onset  . COPD Mother   . Hypertension Mother   . COPD Maternal Grandmother   . Hypertension Maternal Grandmother      Exam       Pelvic Exam:    Perineum: Normal Perineum   Vulva: normal   Vagina:  normal mucosa, normal discharge, no palpable nodules   Uterus Normal, Gravid, FH: @ umbilicus     Cervix: Normal pap collected   Adnexa: Not palpable   Urinary:  urethral meatus normal    System: Breast:  normal appearance, no masses or tenderness   Skin: normal coloration and turgor, no rashes    Neurologic: oriented, normal, normal mood   Extremities: normal strength, tone, and muscle mass   HEENT PERRLA   Mouth/Teeth mucous membranes moist, normal dentition   Neck supple and no masses   Cardiovascular: regular rate and rhythm   Respiratory:  appears well, vitals normal, no respiratory distress, acyanotic   Abdomen: soft, non-tender;  FHR: 158          Assessment:    Pregnancy: G2P1001 Patient Active Problem  List   Diagnosis Date Noted  . Late prenatal care 06/11/2014  . Supervision of other normal pregnancy 06/11/2014        Plan:     Initial labs drawn. Continue prenatal vitamins  Problem list reviewed and updated  Reviewed recommended weight gain based on pre-gravid BMI  Encouraged well-balanced diet Genetic Screening discussed Quad Screen: requested.  Ultrasound discussed; fetal survey: results reviewed.  Follow up in 4 weeks for Low-risk ob appt   CRESENZO-DISHMAN,Lucila Klecka 06/11/2014

## 2014-06-12 LAB — URINALYSIS
BILIRUBIN URINE: NEGATIVE
Glucose, UA: NEGATIVE mg/dL
HGB URINE DIPSTICK: NEGATIVE
Ketones, ur: NEGATIVE mg/dL
Leukocytes, UA: NEGATIVE
NITRITE: NEGATIVE
PROTEIN: NEGATIVE mg/dL
SPECIFIC GRAVITY, URINE: 1.018 (ref 1.005–1.030)
Urobilinogen, UA: 0.2 mg/dL (ref 0.0–1.0)
pH: 6.5 (ref 5.0–8.0)

## 2014-06-12 LAB — HIV ANTIBODY (ROUTINE TESTING W REFLEX): HIV 1&2 Ab, 4th Generation: NONREACTIVE

## 2014-06-12 LAB — AFP, QUAD SCREEN
AFP: 73.7 IU/mL
Age Alone: 1:1160 {titer}
Curr Gest Age: 19.4 wks.days
HCG TOTAL: 2844 m[IU]/mL
INH: 167.1 pg/mL
Interpretation-AFP: NEGATIVE
MoM for AFP: 1.54
MoM for INH: 0.9
MoM for hCG: 0.18
Open Spina bifida: NEGATIVE
Osb Risk: 1:2500 {titer}
Tri 18 Scr Risk Est: NEGATIVE
Trisomy 18 (Edward) Syndrome Interp.: 1:2900 {titer}
UE3 VALUE: 1.7 ng/mL
uE3 Mom: 1.44

## 2014-06-12 LAB — URINE CULTURE
Colony Count: NO GROWTH
Organism ID, Bacteria: NO GROWTH

## 2014-06-12 LAB — ANTIBODY SCREEN: ANTIBODY SCREEN: NEGATIVE

## 2014-06-12 LAB — DRUG SCREEN, URINE, NO CONFIRMATION
Amphetamine Screen, Ur: NEGATIVE
BENZODIAZEPINES.: NEGATIVE
Barbiturate Quant, Ur: NEGATIVE
Cocaine Metabolites: NEGATIVE
Creatinine,U: 112.2 mg/dL
METHADONE: NEGATIVE
Marijuana Metabolite: NEGATIVE
Opiate Screen, Urine: NEGATIVE
Phencyclidine (PCP): NEGATIVE
Propoxyphene: NEGATIVE

## 2014-06-12 LAB — OXYCODONE SCREEN, UA, RFLX CONFIRM: OXYCODONE SCRN UR: NEGATIVE ng/mL

## 2014-06-12 LAB — RPR

## 2014-06-12 LAB — ABO AND RH: Rh Type: NEGATIVE

## 2014-06-12 LAB — VARICELLA ZOSTER ANTIBODY, IGG: Varicella IgG: 1064 Index — ABNORMAL HIGH (ref <135.00)

## 2014-06-12 LAB — HEPATITIS B SURFACE ANTIGEN: Hepatitis B Surface Ag: NEGATIVE

## 2014-06-12 LAB — RUBELLA SCREEN: RUBELLA: 1.09 {index} — AB (ref <0.90)

## 2014-06-15 LAB — CYTOLOGY - PAP

## 2014-06-17 ENCOUNTER — Encounter: Payer: Self-pay | Admitting: Advanced Practice Midwife

## 2014-07-09 ENCOUNTER — Encounter: Payer: Self-pay | Admitting: Advanced Practice Midwife

## 2014-07-09 ENCOUNTER — Ambulatory Visit (INDEPENDENT_AMBULATORY_CARE_PROVIDER_SITE_OTHER): Payer: Medicaid Other | Admitting: Advanced Practice Midwife

## 2014-07-09 VITALS — BP 120/82 | Wt 166.0 lb

## 2014-07-09 DIAGNOSIS — Z1389 Encounter for screening for other disorder: Secondary | ICD-10-CM

## 2014-07-09 DIAGNOSIS — Z3480 Encounter for supervision of other normal pregnancy, unspecified trimester: Secondary | ICD-10-CM

## 2014-07-09 DIAGNOSIS — Z3482 Encounter for supervision of other normal pregnancy, second trimester: Secondary | ICD-10-CM

## 2014-07-09 DIAGNOSIS — Z331 Pregnant state, incidental: Secondary | ICD-10-CM

## 2014-07-09 LAB — POCT URINALYSIS DIPSTICK
Glucose, UA: NEGATIVE
Ketones, UA: NEGATIVE
Leukocytes, UA: NEGATIVE
Nitrite, UA: NEGATIVE
PROTEIN UA: NEGATIVE
RBC UA: NEGATIVE

## 2014-07-09 NOTE — Patient Instructions (Signed)
1. Before your test, do not eat or drink anything for 8-10 hours prior to your  appointment (a small amount of water is allowed and you may take any medicines you normally take). Be sure to drink lots of water the day before. 2. When you arrive, your blood will be drawn for a 'fasting' blood sugar level.  Then you will be given a sweetened carbonated beverage to drink. You should  complete drinking this beverage within five minutes. After finishing the  beverage, you will have your blood drawn exactly 1 and 2 hours later. Having  your blood drawn on time is an important part of this test. A total of three blood  samples will be done. 3. The test takes approximately 2  hours. During the test, do not have anything to  eat or drink. Do not smoke, chew gum (not even sugarless gum) or use breath mints.  4. During the test you should remain close by and seated as much as possible and  avoid walking around. You may want to bring a book or something else to  occupy your time.  5. After your test, you may eat and drink as normal. You may want to bring a snack  to eat after the test is finished. Your provider will advise you as to the results of  this test and any follow-up if necessary  You will also be retested for syphilis, HIV and blood levels (anemia):  You were already tested in the first trimester, but [facility] recommends retesting.  Additionally, you will be tested for Type 2 Herpes. MOST people do not know that they have genital herpes, as only around 15% of people have outbreaks.  However, it is still transmittable to other people, including the baby (but only during the birth).  If you test positive for Type 2 Herpes, we place you on a medicine called acyclovir the last 6 weeks of your pregnancy to prevent transmission of the virus to the baby during the birth.    If your sugar test is positive for gestational diabetes, you will be given an phone call and further instructions discussed.   We typically do not call patients with positive herpes results, but will discuss it at your next appointment.  If you wish to know all of your test results before your next appointment, feel free to call the office, or look up your test results on Mychart.  (The range that the lab uses for normal values of the sugar test are not necessarily the range that is used for pregnant women; if your results are within the range, they are definitely normal.  However, if a value is deemed "high" by the lab, it may not be too high for a pregnant woman.  We will need to discuss the normal range if your value(s) fall in the "high" category).     Sometime between 27 and 36 weeks, it is recommended that you and anyone who is going to be in close contact with your baby receive the Tdap booster.  You should receive it EACH pregnancy, regardless of when your last booster was.  You may go to the Health Department (no appointment necessary) or your Primary Care office to receive the vaccine.  If you do not receive the vaccine prior to delivery, it will be offered in the hospital.  However, if you get it at least 2 weeks prior to delivery, you will have the added advantage of passing the immunity to your baby.   

## 2014-07-09 NOTE — Progress Notes (Signed)
Pt denies any problems or concerns at this time.  

## 2014-07-09 NOTE — Progress Notes (Signed)
G2P1001 5312w4d Estimated Date of Delivery: 11/01/14  Blood pressure 120/82, weight 166 lb (75.297 kg), last menstrual period 01/25/2014, not currently breastfeeding.   BP weight and urine results all reviewed and noted.  Please refer to the obstetrical flow sheet for the fundal height and fetal heart rate documentation:  Patient reports good fetal movement, denies any bleeding and no rupture of membranes symptoms or regular contractions. Patient is without complaints except normal pregnancy complaints All questions were answered.  Plan:  Continued routine obstetrical care,   Follow up in 4 weeks for OB appointment, PN2

## 2014-08-06 ENCOUNTER — Ambulatory Visit (INDEPENDENT_AMBULATORY_CARE_PROVIDER_SITE_OTHER): Payer: Medicaid Other | Admitting: Advanced Practice Midwife

## 2014-08-06 ENCOUNTER — Other Ambulatory Visit: Payer: Medicaid Other

## 2014-08-06 ENCOUNTER — Encounter: Payer: Self-pay | Admitting: Advanced Practice Midwife

## 2014-08-06 VITALS — BP 120/76 | Wt 176.0 lb

## 2014-08-06 DIAGNOSIS — Z131 Encounter for screening for diabetes mellitus: Secondary | ICD-10-CM

## 2014-08-06 DIAGNOSIS — Z1389 Encounter for screening for other disorder: Secondary | ICD-10-CM

## 2014-08-06 DIAGNOSIS — Z3482 Encounter for supervision of other normal pregnancy, second trimester: Secondary | ICD-10-CM

## 2014-08-06 DIAGNOSIS — Z3483 Encounter for supervision of other normal pregnancy, third trimester: Secondary | ICD-10-CM

## 2014-08-06 DIAGNOSIS — Z1159 Encounter for screening for other viral diseases: Secondary | ICD-10-CM

## 2014-08-06 DIAGNOSIS — Z331 Pregnant state, incidental: Secondary | ICD-10-CM

## 2014-08-06 DIAGNOSIS — Z3480 Encounter for supervision of other normal pregnancy, unspecified trimester: Secondary | ICD-10-CM

## 2014-08-06 DIAGNOSIS — Z0184 Encounter for antibody response examination: Secondary | ICD-10-CM

## 2014-08-06 LAB — POCT URINALYSIS DIPSTICK
Blood, UA: NEGATIVE
Glucose, UA: NEGATIVE
Ketones, UA: NEGATIVE
Leukocytes, UA: NEGATIVE
NITRITE UA: NEGATIVE
PROTEIN UA: NEGATIVE

## 2014-08-06 LAB — CBC
HEMATOCRIT: 31 % — AB (ref 36.0–46.0)
Hemoglobin: 10.5 g/dL — ABNORMAL LOW (ref 12.0–15.0)
MCH: 27.2 pg (ref 26.0–34.0)
MCHC: 33.9 g/dL (ref 30.0–36.0)
MCV: 80.3 fL (ref 78.0–100.0)
Platelets: 263 10*3/uL (ref 150–400)
RBC: 3.86 MIL/uL — ABNORMAL LOW (ref 3.87–5.11)
RDW: 14.3 % (ref 11.5–15.5)
WBC: 13.3 10*3/uL — ABNORMAL HIGH (ref 4.0–10.5)

## 2014-08-06 NOTE — Progress Notes (Signed)
G2P1001 [redacted]w[redacted]d Estimated Date of Delivery: 11/01/14  Blood pressure 120/76, weight 176 lb (79.833 kg), last menstrual period 01/25/2014, not currently breastfeeding.   BP weight and urine results all reviewed and noted.  Please refer to the obstetrical flow sheet for the fundal height and fetal heart rate documentation:  Patient reports good fetal movement, denies any bleeding and no rupture of membranes symptoms or regular contractions. Patient is without complaints. All questions were answered.  Plan:  Continued routine obstetrical care, doing PN2 today  Follow up in 4 weeks for OB appointment,

## 2014-08-06 NOTE — Progress Notes (Signed)
Pt denies any problems or concerns at this time.  

## 2014-08-07 LAB — RPR

## 2014-08-07 LAB — HIV ANTIBODY (ROUTINE TESTING W REFLEX): HIV 1&2 Ab, 4th Generation: NONREACTIVE

## 2014-08-07 LAB — GLUCOSE TOLERANCE, 2 HOURS W/ 1HR
Glucose, 1 hour: 121 mg/dL (ref 70–170)
Glucose, 2 hour: 111 mg/dL (ref 70–139)
Glucose, Fasting: 75 mg/dL (ref 70–99)

## 2014-08-07 LAB — HSV 2 ANTIBODY, IGG

## 2014-08-07 LAB — ANTIBODY SCREEN: ANTIBODY SCREEN: NEGATIVE

## 2014-08-11 ENCOUNTER — Encounter: Payer: Self-pay | Admitting: Advanced Practice Midwife

## 2014-08-17 ENCOUNTER — Ambulatory Visit (INDEPENDENT_AMBULATORY_CARE_PROVIDER_SITE_OTHER): Payer: Medicaid Other | Admitting: Women's Health

## 2014-08-17 ENCOUNTER — Encounter (HOSPITAL_COMMUNITY): Payer: Self-pay

## 2014-08-17 ENCOUNTER — Inpatient Hospital Stay (HOSPITAL_COMMUNITY): Payer: Medicaid Other

## 2014-08-17 ENCOUNTER — Encounter: Payer: Self-pay | Admitting: Women's Health

## 2014-08-17 ENCOUNTER — Inpatient Hospital Stay (HOSPITAL_COMMUNITY)
Admission: AD | Admit: 2014-08-17 | Discharge: 2014-08-17 | Disposition: A | Discharge status: Home / Self Care | Payer: Medicaid Other | Source: Ambulatory Visit | Attending: Family Medicine | Admitting: Family Medicine

## 2014-08-17 VITALS — BP 138/58 | Wt 179.0 lb

## 2014-08-17 DIAGNOSIS — Z3480 Encounter for supervision of other normal pregnancy, unspecified trimester: Secondary | ICD-10-CM

## 2014-08-17 DIAGNOSIS — Y93F1 Activity, caregiving, bathing: Secondary | ICD-10-CM | POA: Insufficient documentation

## 2014-08-17 DIAGNOSIS — O360131 Maternal care for anti-D [Rh] antibodies, third trimester, fetus 1: Secondary | ICD-10-CM

## 2014-08-17 DIAGNOSIS — O9989 Other specified diseases and conditions complicating pregnancy, childbirth and the puerperium: Principal | ICD-10-CM

## 2014-08-17 DIAGNOSIS — O99891 Other specified diseases and conditions complicating pregnancy: Secondary | ICD-10-CM | POA: Diagnosis present

## 2014-08-17 DIAGNOSIS — Z3483 Encounter for supervision of other normal pregnancy, third trimester: Secondary | ICD-10-CM

## 2014-08-17 DIAGNOSIS — O26899 Other specified pregnancy related conditions, unspecified trimester: Secondary | ICD-10-CM

## 2014-08-17 DIAGNOSIS — W010XXA Fall on same level from slipping, tripping and stumbling without subsequent striking against object, initial encounter: Secondary | ICD-10-CM | POA: Diagnosis not present

## 2014-08-17 DIAGNOSIS — Z331 Pregnant state, incidental: Secondary | ICD-10-CM

## 2014-08-17 DIAGNOSIS — O0932 Supervision of pregnancy with insufficient antenatal care, second trimester: Secondary | ICD-10-CM

## 2014-08-17 DIAGNOSIS — O368190 Decreased fetal movements, unspecified trimester, not applicable or unspecified: Secondary | ICD-10-CM

## 2014-08-17 DIAGNOSIS — Z6791 Unspecified blood type, Rh negative: Secondary | ICD-10-CM | POA: Insufficient documentation

## 2014-08-17 DIAGNOSIS — Y92009 Unspecified place in unspecified non-institutional (private) residence as the place of occurrence of the external cause: Secondary | ICD-10-CM | POA: Diagnosis not present

## 2014-08-17 DIAGNOSIS — Z1389 Encounter for screening for other disorder: Secondary | ICD-10-CM

## 2014-08-17 DIAGNOSIS — O0930 Supervision of pregnancy with insufficient antenatal care, unspecified trimester: Secondary | ICD-10-CM | POA: Diagnosis not present

## 2014-08-17 DIAGNOSIS — O360990 Maternal care for other rhesus isoimmunization, unspecified trimester, not applicable or unspecified: Secondary | ICD-10-CM | POA: Diagnosis not present

## 2014-08-17 DIAGNOSIS — S3981XA Other specified injuries of abdomen, initial encounter: Secondary | ICD-10-CM

## 2014-08-17 DIAGNOSIS — W1809XA Striking against other object with subsequent fall, initial encounter: Secondary | ICD-10-CM

## 2014-08-17 DIAGNOSIS — W19XXXA Unspecified fall, initial encounter: Secondary | ICD-10-CM

## 2014-08-17 LAB — POCT URINALYSIS DIPSTICK
Blood, UA: NEGATIVE
Glucose, UA: NEGATIVE
Ketones, UA: NEGATIVE
Leukocytes, UA: NEGATIVE
Nitrite, UA: NEGATIVE
PROTEIN UA: NEGATIVE

## 2014-08-17 LAB — CBC WITH DIFFERENTIAL/PLATELET
Basophils Absolute: 0 10*3/uL (ref 0.0–0.1)
Basophils Relative: 0 % (ref 0–1)
EOS PCT: 1 % (ref 0–5)
Eosinophils Absolute: 0.1 10*3/uL (ref 0.0–0.7)
HCT: 31.8 % — ABNORMAL LOW (ref 36.0–46.0)
Hemoglobin: 10.4 g/dL — ABNORMAL LOW (ref 12.0–15.0)
LYMPHS PCT: 17 % (ref 12–46)
Lymphs Abs: 2.1 10*3/uL (ref 0.7–4.0)
MCH: 27.2 pg (ref 26.0–34.0)
MCHC: 32.7 g/dL (ref 30.0–36.0)
MCV: 83.2 fL (ref 78.0–100.0)
MONO ABS: 0.9 10*3/uL (ref 0.1–1.0)
Monocytes Relative: 7 % (ref 3–12)
Neutro Abs: 8.9 10*3/uL — ABNORMAL HIGH (ref 1.7–7.7)
Neutrophils Relative %: 75 % (ref 43–77)
PLATELETS: 253 10*3/uL (ref 150–400)
RBC: 3.82 MIL/uL — ABNORMAL LOW (ref 3.87–5.11)
RDW: 13.6 % (ref 11.5–15.5)
WBC: 12 10*3/uL — AB (ref 4.0–10.5)

## 2014-08-17 MED ORDER — RHO D IMMUNE GLOBULIN 1500 UNIT/2ML IJ SOSY
300.0000 ug | PREFILLED_SYRINGE | Freq: Once | INTRAMUSCULAR | Status: AC
  Administered 2014-08-17: 300 ug via INTRAMUSCULAR

## 2014-08-17 NOTE — Progress Notes (Signed)
Work in Low-risk OB appointment G2P1001 [redacted]w[redacted]d Estimated Date of Delivery: 11/01/14 BP 138/58  Wt 179 lb (81.194 kg)  LMP 01/25/2014  BP, weight, and urine reviewed.  Refer to obstetrical flow sheet for FH & FHR.  Larey Seat out of shower today around 1000, hit Rt side of abd on side of tub. Decreased fm since fall. Denies regular uc's, lof, vb, or uti s/s.  EFM: baseline 110, mod variability, 1 10x10 accel, no decles- overall reassuring for GA, 1 uc w/ some ui, also reviewed by JVF Rh-, Rhogam given today No visible bruising of abdomen, abd non-tender Offered additional monitoring at whog per s/p fall protocol, notified dr. Su Hilt to expect pt Reviewed ptl s/s, fkc, warning s/s to report. Plan:  Continue routine obstetrical care  F/U as scheduled for OB appointment

## 2014-08-17 NOTE — MAU Provider Note (Signed)
Chief Complaint:  Fall   None     HPI: Chelsea Price is a 23 y.o. G2P1001 at [redacted]w[redacted]d who presents to maternity admissions reporting fall onto abdomen at home.  Per patient, she was at home earlier in day when she slipped and fell in shower. She reports hitting the lower right quadrant of her abdomen. Denies any abdominal pain, and leakage of fluid, VB. Originally reported decreased fetal movement, but this has now resolved.  Went to primary OB where she has NST preformed which was reactive, but had one contraction noted, thus sent to MAU for prolonged monitoring. Received rho-gham at primary OB.  Denies any ongoing abdominal pain, contractions, leakage of fluid or vaginal bleeding. Good fetal movement currently.   Pregnancy Course:  LTC at 18 wks Rh neg  Past Medical History: History reviewed. No pertinent past medical history.  Past obstetric history: OB History  Gravida Para Term Preterm AB SAB TAB Ectopic Multiple Living  # Outcome Date GA Lbr Len/2nd Weight Sex Delivery Anes PTL Lv  2 CUR           1 TRM 11/13/12 [redacted]w[redacted]d 03:14 / 00:28 7 lb 10.9 oz (3.485 kg) M SVD Pud  Y     Comments: WNL      Past Surgical History: Past Surgical History  Procedure Laterality Date  . Wisdom tooth extraction    . Wisdom tooth extraction       Family History: Family History  Problem Relation Age of Onset  . COPD Mother   . Hypertension Mother   . COPD Maternal Grandmother   . Hypertension Maternal Grandmother     Social History: History  Substance Use Topics  . Smoking status: Never Smoker   . Smokeless tobacco: Never Used  . Alcohol Use: No    Allergies:  Allergies  Allergen Reactions  . Latex Hives    Meds:  Prescriptions prior to admission  Medication Sig Dispense Refill  . Pediatric Multiple Vit-C-FA (FLINSTONES GUMMIES OMEGA-3 DHA) CHEW Chew by mouth. Take 2 daily        ROS: Pertinent findings in history of present illness.  Physical Exam   Blood pressure 135/82, pulse 114, temperature 99.8 F (37.7 C), temperature source Oral, resp. rate 18, height 5' 1.5" (1.562 m), weight 179 lb (81.194 kg), last menstrual period 01/25/2014, not currently breastfeeding. GENERAL: Well-developed, well-nourished female in no acute distress.  HEENT: normocephalic HEART: normal rate RESP: normal effort ABDOMEN: Soft, non-tender, gravid appropriate for gestational age EXTREMITIES: Nontender, no edema NEURO: alert and oriented SPECULUM EXAM: not indicated     FHT:  Baseline 120 , moderate variability, accelerations present, no decelerations Contractions: Mild irritability upon admission, resolved. Total of 2 contractions in 4 hours of observatio   Labs: Results for orders placed during the hospital encounter of 08/17/14 (from the past 24 hour(s))  CBC WITH DIFFERENTIAL     Status: Abnormal   Collection Time    08/17/14  5:25 PM      Result Value Ref Range   WBC 12.0 (*) 4.0 - 10.5 K/uL   RBC 3.82 (*) 3.87 - 5.11 MIL/uL   Hemoglobin 10.4 (*) 12.0 - 15.0 g/dL   HCT 40.9 (*) 81.1 - 91.4 %   MCV 83.2  78.0 - 100.0 fL   MCH 27.2  26.0 - 34.0 pg   MCHC 32.7  30.0 - 36.0 g/dL   RDW 13.6  11.5 - 15.5 %   Platelets 253  150 - 400 K/uL   Neutrophils Relative % 75  43 - 77 %   Neutro Abs 8.9 (*) 1.7 - 7.7 K/uL   Lymphocytes Relative 17  12 - 46 %   Lymphs Abs 2.1  0.7 - 4.0 K/uL   Monocytes Relative 7  3 - 12 %   Monocytes Absolute 0.9  0.1 - 1.0 K/uL   Eosinophils Relative 1  0 - 5 %   Eosinophils Absolute 0.1  0.0 - 0.7 K/uL   Basophils Relative 0  0 - 1 %   Basophils Absolute 0.0  0.0 - 0.1 K/uL    Imaging:  No results found. MAU Course:  Pt admitted to MAU for prolonged monitoring after fall onto abdomen. She had no vaginal bleeding, no abdominal pain, and no reports of contractions. Originally reported decreased fetal movement, but this resolved upon arrival to MAU.  CBC stable, rhogham received at primary OB.  Was observed in  MAU for a total of 4 hours with reactive NST and only 2 contractions in 4 hours. Given < 6 contractions in 4 hours, no evidence of abruption and reassuring fetal status, safe for d/c home.  Assessment: 1. Rh negative state in antepartum period, third trimester, fetus 1   2. Late prenatal care, second trimester   3. Encounter for supervision of other normal pregnancy in third trimester     Plan: Discharge home Abruption precautions Fetal kick counts     Medication List    ASK your doctor about these medications       FLINSTONES GUMMIES OMEGA-3 DHA Chew  Chew by mouth. Take 2 daily        Ethelda Chick, MD 08/17/2014 7:49 PM

## 2014-08-17 NOTE — Patient Instructions (Signed)

## 2014-08-17 NOTE — Progress Notes (Signed)
Notified of pt arrival in MAU. Will come see pt after ultrasound. Pt will be monitored for 4 hours

## 2014-08-17 NOTE — MAU Note (Addendum)
felll this morning in the shower, hit lower part of rt abd and upper rt leg. Cramping in lower abd started about 3 hrs after fall.   Was at dr's today, received Rhogham.

## 2014-08-18 ENCOUNTER — Encounter: Payer: Self-pay | Admitting: Obstetrics & Gynecology

## 2014-08-18 NOTE — Progress Notes (Signed)
Patient ID: Chelsea Price, female   DOB: 04/15/1991, 23 y.o.   MRN: [id number] Pt's mother called said Chelsea Price was in our office yesterday after she fell and we sent her to [facility] she is still in pain today and also having trouble breathing spoke with Morrie SheldonAshley and we [facility] pt [facility] since she is having breathing issues

## 2014-08-19 NOTE — MAU Provider Note (Signed)
Attestation of Attending Supervision of Obstetric Fellow: Evaluation and management procedures were performed by the Obstetric Fellow under my supervision and collaboration.  I have reviewed the Obstetric Fellow's note and chart, and I agree with the management and plan.  Sahas Sluka, DO Attending Physician Faculty Practice, [facility]  

## 2014-09-03 ENCOUNTER — Ambulatory Visit (INDEPENDENT_AMBULATORY_CARE_PROVIDER_SITE_OTHER): Payer: Medicaid Other | Admitting: Advanced Practice Midwife

## 2014-09-03 ENCOUNTER — Encounter: Payer: Self-pay | Admitting: Advanced Practice Midwife

## 2014-09-03 VITALS — BP 128/70 | Wt 178.0 lb

## 2014-09-03 DIAGNOSIS — Z1389 Encounter for screening for other disorder: Secondary | ICD-10-CM

## 2014-09-03 DIAGNOSIS — Z3483 Encounter for supervision of other normal pregnancy, third trimester: Secondary | ICD-10-CM

## 2014-09-03 DIAGNOSIS — Z331 Pregnant state, incidental: Secondary | ICD-10-CM

## 2014-09-03 DIAGNOSIS — Z23 Encounter for immunization: Secondary | ICD-10-CM

## 2014-09-03 NOTE — Progress Notes (Signed)
,  G2P1001 7033w4d Estimated Date of Delivery: 11/01/14  Blood pressure 128/70, weight 178 lb (80.74 kg), last menstrual period 01/25/2014, not currently breastfeeding.   BP weight and urine results all reviewed and noted.  Please refer to the obstetrical flow sheet for the fundal height and fetal heart rate documentation:  Patient reports good fetal movement, denies any bleeding OR regular contractions.  States woke up 2 days ago "with a big wet spot in the bed".  No leaking since. SSE:  No pooling, negative fern/valsalva Patient is without complaints. All questions were answered.  Plan:  Continued routine obstetrical care,   Follow up in 2 weeks for OB appointment,

## 2014-09-03 NOTE — Progress Notes (Signed)
Pt states that 2 days ago she woke and there was a big wet spot in the bed, no color or odor.

## 2014-09-17 ENCOUNTER — Encounter: Payer: Medicaid Other | Admitting: Advanced Practice Midwife

## 2014-09-21 ENCOUNTER — Encounter: Payer: Self-pay | Admitting: Advanced Practice Midwife

## 2014-09-22 ENCOUNTER — Ambulatory Visit (INDEPENDENT_AMBULATORY_CARE_PROVIDER_SITE_OTHER): Payer: Medicaid Other | Admitting: Advanced Practice Midwife

## 2014-09-22 VITALS — BP 128/88 | Wt 179.0 lb

## 2014-09-22 DIAGNOSIS — Z331 Pregnant state, incidental: Secondary | ICD-10-CM

## 2014-09-22 DIAGNOSIS — O2343 Unspecified infection of urinary tract in pregnancy, third trimester: Secondary | ICD-10-CM

## 2014-09-22 DIAGNOSIS — Z3483 Encounter for supervision of other normal pregnancy, third trimester: Secondary | ICD-10-CM

## 2014-09-22 DIAGNOSIS — Z1389 Encounter for screening for other disorder: Secondary | ICD-10-CM

## 2014-09-22 LAB — POCT URINALYSIS DIPSTICK
Blood, UA: NEGATIVE
Glucose, UA: NEGATIVE
Ketones, UA: NEGATIVE
Leukocytes, UA: NEGATIVE
NITRITE UA: POSITIVE

## 2014-09-22 MED ORDER — NITROFURANTOIN MONOHYD MACRO 100 MG PO CAPS: 100.0000 mg | ORAL_CAPSULE | Freq: Two times a day (BID) | ORAL | Status: AC

## 2014-09-22 NOTE — Progress Notes (Signed)
G2P1001 698w2d Estimated Date of Delivery: 11/01/14  Blood pressure 128/88, weight 179 lb (81.194 kg), last menstrual period 01/25/2014, not currently breastfeeding.   BP weight and urine results all reviewed and noted.  No UTI sx but + nitrites  Please refer to the obstetrical flow sheet for the fundal height and fetal heart rate documentation:  Patient reports good fetal movement, denies any bleeding and no rupture of membranes symptoms or regular contractions. Patient is without complaints. All questions were answered.  Plan:  Continued routine obstetrical care, treat UTI  Follow up in 2 weeks for OB appointment,

## 2014-09-22 NOTE — Addendum Note (Signed)
Addended by: PULLIAM, CHRYSTAL G on: 12/02/2013 04:16 PM   Modules accepted: Orders  

## 2014-09-25 LAB — URINE CULTURE: Colony Count: >=100000

## 2014-10-06 ENCOUNTER — Encounter: Payer: Self-pay | Admitting: Women's Health

## 2014-10-06 ENCOUNTER — Ambulatory Visit (INDEPENDENT_AMBULATORY_CARE_PROVIDER_SITE_OTHER): Payer: Medicaid Other | Admitting: Women's Health

## 2014-10-06 VITALS — BP 124/68 | Wt 184.0 lb

## 2014-10-06 DIAGNOSIS — Z331 Pregnant state, incidental: Secondary | ICD-10-CM

## 2014-10-06 DIAGNOSIS — Z3493 Encounter for supervision of normal pregnancy, unspecified, third trimester: Secondary | ICD-10-CM

## 2014-10-06 DIAGNOSIS — Z1389 Encounter for screening for other disorder: Secondary | ICD-10-CM

## 2014-10-06 LAB — POCT URINALYSIS DIPSTICK
Glucose, UA: NEGATIVE
KETONES UA: NEGATIVE
Leukocytes, UA: NEGATIVE
NITRITE UA: NEGATIVE
Protein, UA: NEGATIVE
RBC UA: NEGATIVE

## 2014-10-06 NOTE — Progress Notes (Signed)
Low-risk OB appointment G2P1001 2024w2d Estimated Date of Delivery: 11/01/14 BP 124/68 mmHg  Wt 184 lb (83.462 kg)  LMP 01/25/2014  BP, weight, and urine reviewed.  Refer to obstetrical flow sheet for FH & FHR.  Reports good fm.  Denies regular uc's, lof, vb, or uti s/s. No complaints. Reviewed ptl s/s, fkc. Plan:  Continue routine obstetrical care  F/U in 1wk for OB appointment and gbs

## 2014-10-06 NOTE — Patient Instructions (Signed)
Call the office ([phone]) or go to [facility] if:  You begin to have strong, frequent contractions  Your water breaks.  Sometimes it is a big gush of fluid, sometimes it is just a trickle that keeps getting your panties wet or running down your legs  You have vaginal bleeding.  It is normal to have a small amount of spotting if your cervix was checked.   You don't feel your baby moving like normal.  If you don't, get you something to eat and drink and lay down and focus on feeling your baby move.  You should feel at least 10 movements in 2 hours.  If you don't, you should call the office or go to [facility].    Preterm Labor Information Preterm labor is when labor starts at less than 37 weeks of pregnancy. The normal length of a pregnancy is 39 to 41 weeks. CAUSES Often, there is no identifiable underlying cause as to why a woman goes into preterm labor. One of the most common known causes of preterm labor is infection. Infections of the uterus, cervix, vagina, amniotic sac, bladder, kidney, or even the lungs (pneumonia) can cause labor to start. Other suspected causes of preterm labor include:   Urogenital infections, such as yeast infections and bacterial vaginosis.   Uterine abnormalities (uterine shape, uterine septum, fibroids, or bleeding from the placenta).   A cervix that has been operated on (it may fail to stay closed).   Malformations in the fetus.   Multiple gestations (twins, triplets, and so on).   Breakage of the amniotic sac.  RISK FACTORS  Having a previous history of preterm labor.   Having premature rupture of membranes (PROM).   Having a placenta that covers the opening of the cervix (placenta previa).   Having a placenta that separates from the uterus (placental abruption).   Having a cervix that is too weak to hold the fetus in the uterus (incompetent cervix).   Having too much fluid in the amniotic sac (polyhydramnios).   Taking  illegal drugs or smoking while pregnant.   Not gaining enough weight while pregnant.   Being younger than 18 and older than 21 years old.   Having a low socioeconomic status.   Being African American. SYMPTOMS Signs and symptoms of preterm labor include:   Menstrual-like cramps, abdominal pain, or back pain.  Uterine contractions that are regular, as frequent as six in an hour, regardless of their intensity (may be mild or painful).  Contractions that start on the top of the uterus and spread down to the lower abdomen and back.   A sense of increased pelvic pressure.   A watery or bloody mucus discharge that comes from the vagina.  TREATMENT Depending on the length of the pregnancy and other circumstances, your health care provider may suggest bed rest. If necessary, there are medicines that can be given to stop contractions and to mature the fetal lungs. If labor happens before 34 weeks of pregnancy, a prolonged hospital stay may be recommended. Treatment depends on the condition of both you and the fetus.  WHAT SHOULD YOU DO IF YOU THINK YOU ARE IN PRETERM LABOR? Call your health care provider right away. You will need to go to the hospital to get checked immediately. HOW CAN YOU PREVENT PRETERM LABOR IN FUTURE PREGNANCIES? You should:   Stop smoking if you smoke.  Maintain healthy weight gain and avoid chemicals and drugs that are not necessary.  Be watchful for   any type of infection.  Inform your health care provider if you have a known history of preterm labor. Document Released: 01/27/2004 Document Revised: 07/09/2013 Document Reviewed: 12/09/2012 ExitCare Patient Information 2015 [employer]. This information is not intended to replace advice given to you by your health care provider. Make sure you discuss any questions you have with your health care provider.  

## 2014-10-13 ENCOUNTER — Ambulatory Visit (INDEPENDENT_AMBULATORY_CARE_PROVIDER_SITE_OTHER): Payer: Medicaid Other | Admitting: Advanced Practice Midwife

## 2014-10-13 ENCOUNTER — Encounter: Payer: Self-pay | Admitting: Advanced Practice Midwife

## 2014-10-13 VITALS — BP 122/66 | Wt 183.0 lb

## 2014-10-13 DIAGNOSIS — Z3685 Encounter for antenatal screening for Streptococcus B: Secondary | ICD-10-CM

## 2014-10-13 DIAGNOSIS — Z3493 Encounter for supervision of normal pregnancy, unspecified, third trimester: Secondary | ICD-10-CM

## 2014-10-13 DIAGNOSIS — Z118 Encounter for screening for other infectious and parasitic diseases: Secondary | ICD-10-CM

## 2014-10-13 DIAGNOSIS — Z1159 Encounter for screening for other viral diseases: Secondary | ICD-10-CM

## 2014-10-13 DIAGNOSIS — Z1389 Encounter for screening for other disorder: Secondary | ICD-10-CM

## 2014-10-13 DIAGNOSIS — Z331 Pregnant state, incidental: Secondary | ICD-10-CM

## 2014-10-13 LAB — POCT URINALYSIS DIPSTICK
Blood, UA: NEGATIVE
GLUCOSE UA: NEGATIVE
Ketones, UA: NEGATIVE
Leukocytes, UA: NEGATIVE
Nitrite, UA: NEGATIVE
Protein, UA: NEGATIVE

## 2014-10-13 LAB — OB RESULTS CONSOLE GC/CHLAMYDIA
CHLAMYDIA, DNA PROBE: NEGATIVE
Gonorrhea: NEGATIVE

## 2014-10-13 NOTE — Progress Notes (Signed)
Pt denies any problems or concerns at this time.  

## 2014-10-13 NOTE — Progress Notes (Signed)
G2P1001 741w2d Estimated Date of Delivery: 11/01/14  Blood pressure 122/66, weight 183 lb (83.008 kg), last menstrual period 01/25/2014, not currently breastfeeding.   BP weight and urine results all reviewed and noted.  Please refer to the obstetrical flow sheet for the fundal height and fetal heart rate documentation:  Patient reports good fetal movement, denies any bleeding and no rupture of membranes symptoms or regular contractions. Patient is without complaints. All questions were answered.  Plan:  Continued routine obstetrical care, GBS today  Follow up in 1 weeks for OB appointment,

## 2014-10-14 LAB — GC/CHLAMYDIA PROBE AMP
CT PROBE, AMP APTIMA: NEGATIVE
GC Probe RNA: NEGATIVE

## 2014-10-15 LAB — STREP B DNA PROBE: STREP GROUP B AG: NOT DETECTED

## 2014-10-20 ENCOUNTER — Ambulatory Visit (INDEPENDENT_AMBULATORY_CARE_PROVIDER_SITE_OTHER): Payer: Medicaid Other | Admitting: Women's Health

## 2014-10-20 VITALS — BP 138/82 | Wt 183.6 lb

## 2014-10-20 DIAGNOSIS — Z3483 Encounter for supervision of other normal pregnancy, third trimester: Secondary | ICD-10-CM

## 2014-10-20 DIAGNOSIS — Z331 Pregnant state, incidental: Secondary | ICD-10-CM

## 2014-10-20 DIAGNOSIS — Z1389 Encounter for screening for other disorder: Secondary | ICD-10-CM

## 2014-10-20 LAB — POCT URINALYSIS DIPSTICK
Glucose, UA: NEGATIVE
Ketones, UA: NEGATIVE
Leukocytes, UA: NEGATIVE
NITRITE UA: NEGATIVE
PROTEIN UA: NEGATIVE
RBC UA: NEGATIVE

## 2014-10-20 NOTE — Progress Notes (Signed)
Low-risk OB appointment G2P1001 3127w2d Estimated Date of Delivery: 11/01/14 BP 138/82 mmHg  Wt 183 lb 9.6 oz (83.28 kg)  LMP 01/25/2014  BP, weight, and urine reviewed.  Refer to obstetrical flow sheet for FH & FHR.  Reports good fm.  Denies regular uc's, lof, vb, or uti s/s. No complaints. H/O rapid 1st labor.  SVE per request: 3/80/-2, vtx Reviewed labor s/s, fkc. Plan:  Continue routine obstetrical care  F/U in 1wk for OB appointment

## 2014-10-20 NOTE — Patient Instructions (Signed)
Call the office ([phone]) or go to [facility] if:  You begin to have strong, frequent contractions  Your water breaks.  Sometimes it is a big gush of fluid, sometimes it is just a trickle that keeps getting your panties wet or running down your legs  You have vaginal bleeding.  It is normal to have a small amount of spotting if your cervix was checked.   You don't feel your baby moving like normal.  If you don't, get you something to eat and drink and lay down and focus on feeling your baby move.  You should feel at least 10 movements in 2 hours.  If you don't, you should call the office or go to [facility].    Braxton Hicks Contractions Contractions of the uterus can occur throughout pregnancy. Contractions are not always a sign that you are in labor.  WHAT ARE BRAXTON HICKS CONTRACTIONS?  Contractions that occur before labor are called Braxton Hicks contractions, or false labor. Toward the end of pregnancy (32-34 weeks), these contractions can develop more often and may become more forceful. This is not true labor because these contractions do not result in opening (dilatation) and thinning of the cervix. They are sometimes difficult to tell apart from true labor because these contractions can be forceful and people have different pain tolerances. You should not feel embarrassed if you go to the hospital with false labor. Sometimes, the only way to tell if you are in true labor is for your health care provider to look for changes in the cervix. If there are no prenatal problems or other health problems associated with the pregnancy, it is completely safe to be sent home with false labor and await the onset of true labor. HOW CAN YOU TELL THE DIFFERENCE BETWEEN TRUE AND FALSE LABOR? False Labor  The contractions of false labor are usually shorter and not as hard as those of true labor.   The contractions are usually irregular.   The contractions are often felt in the front of  the lower abdomen and in the groin.   The contractions may go away when you walk around or change positions while lying down.   The contractions get weaker and are shorter lasting as time goes on.   The contractions do not usually become progressively stronger, regular, and closer together as with true labor.  True Labor  Contractions in true labor last 30-70 seconds, become very regular, usually become more intense, and increase in frequency.   The contractions do not go away with walking.   The discomfort is usually felt in the top of the uterus and spreads to the lower abdomen and low back.   True labor can be determined by your health care provider with an exam. This will show that the cervix is dilating and getting thinner.  WHAT TO REMEMBER  Keep up with your usual exercises and follow other instructions given by your health care provider.   Take medicines as directed by your health care provider.   Keep your regular prenatal appointments.   Eat and drink lightly if you think you are going into labor.   If Braxton Hicks contractions are making you uncomfortable:   Change your position from lying down or resting to walking, or from walking to resting.   Sit and rest in a tub of warm water.   Drink 2-3 glasses of water. Dehydration may cause these contractions.   Do slow and deep breathing several times an hour.    WHEN SHOULD I SEEK IMMEDIATE MEDICAL CARE? Seek immediate medical care if:  Your contractions become stronger, more regular, and closer together.   You have fluid leaking or gushing from your vagina.   You have a fever.   You pass blood-tinged mucus.   You have vaginal bleeding.   You have continuous abdominal pain.   You have low back pain that you never had before.   You feel your baby's head pushing down and causing pelvic pressure.   Your baby is not moving as much as it used to.  Document Released: 11/06/2005 Document  Revised: 11/11/2013 Document Reviewed: 08/18/2013 ExitCare Patient Information 2015 [employer]. This information is not intended to replace advice given to you by your health care provider. Make sure you discuss any questions you have with your health care provider.  

## 2014-10-24 ENCOUNTER — Inpatient Hospital Stay (HOSPITAL_COMMUNITY)
Admission: AD | Admit: 2014-10-24 | Discharge: 2014-10-25 | DRG: 775 | Disposition: A | Discharge status: Home / Self Care | Payer: Medicaid Other | Source: Ambulatory Visit | Attending: Family Medicine | Admitting: Family Medicine

## 2014-10-24 ENCOUNTER — Encounter (HOSPITAL_COMMUNITY): Payer: Self-pay | Admitting: *Deleted

## 2014-10-24 ENCOUNTER — Inpatient Hospital Stay (HOSPITAL_COMMUNITY): Payer: Medicaid Other | Admitting: Anesthesiology

## 2014-10-24 DIAGNOSIS — IMO0002 Reserved for concepts with insufficient information to code with codable children: Secondary | ICD-10-CM | POA: Diagnosis not present

## 2014-10-24 DIAGNOSIS — Z8249 Family history of ischemic heart disease and other diseases of the circulatory system: Secondary | ICD-10-CM

## 2014-10-24 DIAGNOSIS — Z825 Family history of asthma and other chronic lower respiratory diseases: Secondary | ICD-10-CM

## 2014-10-24 DIAGNOSIS — O360131 Maternal care for anti-D [Rh] antibodies, third trimester, fetus 1: Secondary | ICD-10-CM

## 2014-10-24 DIAGNOSIS — O0932 Supervision of pregnancy with insufficient antenatal care, second trimester: Secondary | ICD-10-CM

## 2014-10-24 DIAGNOSIS — Z3A38 38 weeks gestation of pregnancy: Secondary | ICD-10-CM | POA: Diagnosis present

## 2014-10-24 DIAGNOSIS — Z3483 Encounter for supervision of other normal pregnancy, third trimester: Secondary | ICD-10-CM

## 2014-10-24 DIAGNOSIS — IMO0001 Reserved for inherently not codable concepts without codable children: Secondary | ICD-10-CM

## 2014-10-24 LAB — CBC
HCT: 31 % — ABNORMAL LOW (ref 36.0–46.0)
Hemoglobin: 9.9 g/dL — ABNORMAL LOW (ref 12.0–15.0)
MCH: 25.1 pg — AB (ref 26.0–34.0)
MCHC: 31.9 g/dL (ref 30.0–36.0)
MCV: 78.5 fL (ref 78.0–100.0)
PLATELETS: 237 10*3/uL (ref 150–400)
RBC: 3.95 MIL/uL (ref 3.87–5.11)
RDW: 14.5 % (ref 11.5–15.5)
WBC: 14 10*3/uL — ABNORMAL HIGH (ref 4.0–10.5)

## 2014-10-24 LAB — HIV ANTIBODY (ROUTINE TESTING W REFLEX): HIV: NONREACTIVE

## 2014-10-24 LAB — RPR

## 2014-10-24 MED ORDER — SENNOSIDES-DOCUSATE SODIUM 8.6-50 MG PO TABS
2.0000 | ORAL_TABLET | ORAL | Status: DC
  Administered 2014-10-24: 2 via ORAL
  Filled 2014-10-24: qty 2

## 2014-10-24 MED ORDER — ACETAMINOPHEN 325 MG PO TABS: 650.0000 mg | ORAL_TABLET | ORAL | Status: DC | PRN

## 2014-10-24 MED ORDER — IBUPROFEN 600 MG PO TABS
600.0000 mg | ORAL_TABLET | Freq: Four times a day (QID) | ORAL | Status: DC
  Administered 2014-10-24 – 2014-10-25 (×3): 600 mg via ORAL
  Filled 2014-10-24 (×3): qty 1

## 2014-10-24 MED ORDER — LIDOCAINE HCL (PF) 1 % IJ SOLN
INTRAMUSCULAR | Status: DC | PRN
  Administered 2014-10-24 (×4): 4 mL

## 2014-10-24 MED ORDER — ONDANSETRON HCL 4 MG/2ML IJ SOLN: 4.0000 mg | INTRAMUSCULAR | Status: DC | PRN

## 2014-10-24 MED ORDER — FENTANYL 2.5 MCG/ML BUPIVACAINE 1/10 % EPIDURAL INFUSION (WH - ANES)
INTRAMUSCULAR | Status: AC
  Administered 2014-10-24: 14 mL/h via EPIDURAL
  Filled 2014-10-24: qty 125

## 2014-10-24 MED ORDER — OXYCODONE-ACETAMINOPHEN 5-325 MG PO TABS: 1.0000 | ORAL_TABLET | ORAL | Status: DC | PRN

## 2014-10-24 MED ORDER — OXYTOCIN 40 UNITS IN LACTATED RINGERS INFUSION - SIMPLE MED
INTRAVENOUS | Status: AC
  Filled 2014-10-24: qty 1000

## 2014-10-24 MED ORDER — HYDROXYZINE HCL 50 MG PO TABS
50.0000 mg | ORAL_TABLET | Freq: Four times a day (QID) | ORAL | Status: DC | PRN
  Filled 2014-10-24: qty 1

## 2014-10-24 MED ORDER — LACTATED RINGERS IV SOLN: 500.0000 mL | INTRAVENOUS | Status: DC | PRN

## 2014-10-24 MED ORDER — TETANUS-DIPHTH-ACELL PERTUSSIS 5-2.5-18.5 LF-MCG/0.5 IM SUSP
0.5000 mL | Freq: Once | INTRAMUSCULAR | Status: AC
  Administered 2014-10-25: 0.5 mL via INTRAMUSCULAR

## 2014-10-24 MED ORDER — DIPHENHYDRAMINE HCL 50 MG/ML IJ SOLN: 12.5000 mg | INTRAMUSCULAR | Status: DC | PRN

## 2014-10-24 MED ORDER — OXYCODONE-ACETAMINOPHEN 5-325 MG PO TABS: 2.0000 | ORAL_TABLET | ORAL | Status: DC | PRN

## 2014-10-24 MED ORDER — CITRIC ACID-SODIUM CITRATE 334-500 MG/5ML PO SOLN: 30.0000 mL | ORAL | Status: DC | PRN

## 2014-10-24 MED ORDER — LACTATED RINGERS IV SOLN
500.0000 mL | Freq: Once | INTRAVENOUS | Status: AC
  Administered 2014-10-24: 50 mL via INTRAVENOUS

## 2014-10-24 MED ORDER — PRENATAL MULTIVITAMIN CH
1.0000 | ORAL_TABLET | Freq: Every day | ORAL | Status: DC
  Administered 2014-10-25: 1 via ORAL
  Filled 2014-10-24: qty 1

## 2014-10-24 MED ORDER — OXYTOCIN 40 UNITS IN LACTATED RINGERS INFUSION - SIMPLE MED: 62.5000 mL/h | INTRAVENOUS | Status: DC

## 2014-10-24 MED ORDER — LACTATED RINGERS IV SOLN
INTRAVENOUS | Status: DC
  Administered 2014-10-24: 17:00:00 via INTRAVENOUS

## 2014-10-24 MED ORDER — LIDOCAINE HCL (PF) 1 % IJ SOLN
INTRAMUSCULAR | Status: AC
  Filled 2014-10-24: qty 30

## 2014-10-24 MED ORDER — FENTANYL CITRATE 0.05 MG/ML IJ SOLN: 100.0000 ug | INTRAMUSCULAR | Status: DC | PRN

## 2014-10-24 MED ORDER — OXYTOCIN BOLUS FROM INFUSION
500.0000 mL | INTRAVENOUS | Status: DC
  Administered 2014-10-24: 500 mL via INTRAVENOUS

## 2014-10-24 MED ORDER — LIDOCAINE HCL (PF) 1 % IJ SOLN
30.0000 mL | INTRAMUSCULAR | Status: DC | PRN
  Filled 2014-10-24: qty 30

## 2014-10-24 MED ORDER — ZOLPIDEM TARTRATE 5 MG PO TABS: 5.0000 mg | ORAL_TABLET | Freq: Every evening | ORAL | Status: DC | PRN

## 2014-10-24 MED ORDER — WITCH HAZEL-GLYCERIN EX PADS: 1.0000 "application " | MEDICATED_PAD | CUTANEOUS | Status: DC | PRN

## 2014-10-24 MED ORDER — EPHEDRINE 5 MG/ML INJ
10.0000 mg | INTRAVENOUS | Status: DC | PRN
  Filled 2014-10-24: qty 2

## 2014-10-24 MED ORDER — PHENYLEPHRINE 40 MCG/ML (10ML) SYRINGE FOR IV PUSH (FOR BLOOD PRESSURE SUPPORT)
PREFILLED_SYRINGE | INTRAVENOUS | Status: AC
  Filled 2014-10-24: qty 10

## 2014-10-24 MED ORDER — ONDANSETRON HCL 4 MG PO TABS: 4.0000 mg | ORAL_TABLET | ORAL | Status: DC | PRN

## 2014-10-24 MED ORDER — LANOLIN HYDROUS EX OINT: TOPICAL_OINTMENT | CUTANEOUS | Status: DC | PRN

## 2014-10-24 MED ORDER — DIBUCAINE 1 % RE OINT: 1.0000 "application " | TOPICAL_OINTMENT | RECTAL | Status: DC | PRN

## 2014-10-24 MED ORDER — PHENYLEPHRINE 40 MCG/ML (10ML) SYRINGE FOR IV PUSH (FOR BLOOD PRESSURE SUPPORT)
80.0000 ug | PREFILLED_SYRINGE | INTRAVENOUS | Status: DC | PRN
  Filled 2014-10-24: qty 2

## 2014-10-24 MED ORDER — PHENYLEPHRINE 40 MCG/ML (10ML) SYRINGE FOR IV PUSH (FOR BLOOD PRESSURE SUPPORT)
80.0000 ug | PREFILLED_SYRINGE | INTRAVENOUS | Status: DC | PRN
Start: 2014-10-24 — End: 2014-10-24
  Filled 2014-10-24: qty 2

## 2014-10-24 MED ORDER — BENZOCAINE-MENTHOL 20-0.5 % EX AERO: 1.0000 "application " | INHALATION_SPRAY | CUTANEOUS | Status: DC | PRN

## 2014-10-24 MED ORDER — SIMETHICONE 80 MG PO CHEW: 80.0000 mg | CHEWABLE_TABLET | ORAL | Status: DC | PRN

## 2014-10-24 MED ORDER — DIPHENHYDRAMINE HCL 25 MG PO CAPS: 25.0000 mg | ORAL_CAPSULE | Freq: Four times a day (QID) | ORAL | Status: DC | PRN

## 2014-10-24 MED ORDER — FLEET ENEMA 7-19 GM/118ML RE ENEM: 1.0000 | ENEMA | RECTAL | Status: DC | PRN

## 2014-10-24 MED ORDER — EPHEDRINE 5 MG/ML INJ
INTRAVENOUS | Status: AC
  Filled 2014-10-24: qty 4

## 2014-10-24 MED ORDER — ONDANSETRON HCL 4 MG/2ML IJ SOLN: 4.0000 mg | Freq: Four times a day (QID) | INTRAMUSCULAR | Status: DC | PRN

## 2014-10-24 MED ORDER — FENTANYL 2.5 MCG/ML BUPIVACAINE 1/10 % EPIDURAL INFUSION (WH - ANES)
14.0000 mL/h | INTRAMUSCULAR | Status: DC | PRN
  Administered 2014-10-24: 14 mL/h via EPIDURAL

## 2014-10-24 NOTE — Progress Notes (Signed)
K Booker CNM notified of pt's VE, orders received.

## 2014-10-24 NOTE — MAU Note (Signed)
Pt presents to MAU with complaints of contractions throughout the night that have gotten more regular this morning. Denies any vaginal bleeding or LOF

## 2014-10-24 NOTE — Anesthesia Preprocedure Evaluation (Signed)
Anesthesia Evaluation  Patient identified by MRN, date of birth, ID band Patient awake    Reviewed: Allergy & Precautions, H&P , NPO status , Patient's Chart, lab work & pertinent test results, reviewed documented beta blocker date and time   History of Anesthesia Complications Negative for: history of anesthetic complications  Airway Mallampati: I  TM Distance: >3 FB Neck ROM: full    Dental  (+) Teeth Intact   Pulmonary neg pulmonary ROS,  breath sounds clear to auscultation        Cardiovascular negative cardio ROS  Rhythm:regular Rate:Normal     Neuro/Psych negative neurological ROS  negative psych ROS   GI/Hepatic negative GI ROS, Neg liver ROS,   Endo/Other  negative endocrine ROSBMI 34.6  Renal/GU negative Renal ROS     Musculoskeletal   Abdominal   Peds  Hematology  (+) anemia ,   Anesthesia Other Findings   Reproductive/Obstetrics (+) Pregnancy                             Anesthesia Physical Anesthesia Plan  ASA: II  Anesthesia Plan: Epidural   Post-op Pain Management:    Induction:   Airway Management Planned:   Additional Equipment:   Intra-op Plan:   Post-operative Plan:   Informed Consent: I have reviewed the patients History and Physical, chart, labs and discussed the procedure including the risks, benefits and alternatives for the proposed anesthesia with the patient or authorized representative who has indicated his/her understanding and acceptance.     Plan Discussed with:   Anesthesia Plan Comments:         Anesthesia Quick Evaluation

## 2014-10-24 NOTE — H&P (Signed)
Chelsea Price is a 23 y.o. female, G2P1001, presenting for spontaneous onset of labor. Labor began this morning at 0230.  Membranes intact.  Positive fetal movement. Denies vaginal bleeding or leaking of fluids.  Received prenatal care at [facility] at 6062w4d gestation.  Pregnancy uncomplicated.    History OB History    Gravida Para Term Preterm AB TAB SAB Ectopic Multiple Living   2 1 1       1      History reviewed. No pertinent past medical history. Past Surgical History  Procedure Laterality Date  . Wisdom tooth extraction    . Wisdom tooth extraction     Family History: family history includes COPD in her maternal grandmother and mother; Hypertension in her maternal grandmother and mother. Social History:  reports that she has never smoked. She has never used smokeless tobacco. She reports that she does not drink alcohol or use illicit drugs.   Prenatal Transfer Tool  Maternal Diabetes: No Genetic Screening: Normal Maternal Ultrasounds/Referrals: Normal Fetal Ultrasounds or other Referrals:  None Maternal Substance Abuse:  No Significant Maternal Medications:  None Significant Maternal Lab Results:  Lab values include: Group B Strep negative, Other: Hgb : 9.9 Other Comments:  None  Review of Systems  Constitutional: Negative.   HENT: Negative.   Eyes: Negative.   Respiratory: Negative.   Cardiovascular: Negative.   Gastrointestinal: Negative.   Genitourinary: Negative.   Musculoskeletal: Negative.   Skin:       Several scabbed areas noted on both legs.  Neurological: Negative.   Endo/Heme/Allergies: Negative.   Psychiatric/Behavioral: Negative.     Dilation: 7 Effacement (%): 100 Station: 0 Exam by:: Sowder,RNC Blood pressure 117/76, pulse 123, temperature 98.6 F (37 C), temperature source Oral, resp. rate 20, height 5\' 1"  (1.549 m), weight 183 lb (83.008 kg), last menstrual period 01/25/2014, SpO2 100 %, not currently breastfeeding. Maternal Exam:   Uterine Assessment: Contraction strength is moderate.  Contraction frequency is regular.   Abdomen: Patient reports no abdominal tenderness. Introitus: Normal vulva. Normal vagina.  Pelvis: adequate for delivery.   Cervix: Cervix evaluated by digital exam.     Fetal Exam Fetal Monitor Review: Mode: hand-held doppler probe.   Baseline rate: 125.  Variability: moderate (6-25 bpm).   Pattern: no accelerations and early decelerations.    Fetal State Assessment: Category II - tracings are indeterminate.    Disregard above statement, fetal state assessment is Category I. Physical Exam  Constitutional: She is oriented to person, place, and time. She appears well-developed and well-nourished.  HENT:  Head: Normocephalic.  Eyes: Pupils are equal, round, and reactive to light.  Neck: Normal range of motion. Neck supple.  Cardiovascular: Normal rate, regular rhythm and normal heart sounds.   Respiratory: Effort normal and breath sounds normal.  GI: Bowel sounds are normal.  Gravid abdomen, firm with contractions  Genitourinary: Vagina normal and uterus normal.  Musculoskeletal: Normal range of motion.  Neurological: She is alert and oriented to person, place, and time.  Skin: Skin is warm and dry.  Psychiatric: She has a normal mood and affect. Her behavior is normal.    Prenatal labs: ABO, Rh: --/--/A NEG (12/05 1240) Antibody: POS (12/05 1240) Rubella: 1.09 (07/23 1552) RPR: NON REAC (09/17 0944)  HBsAg: NEGATIVE (07/23 1552)  HIV: NONREACTIVE (09/17 0944)  GBS: NOT DETECTED (11/24 1238)   Assessment/Plan: IUP @ 3516w6d G2P1001 Category 1 fetal assessment  Expectant management of active labor Epidural NSVD   ADAMS,SHNIQUAL SHWON 10/24/2014,  3:18 PM  I spoke with and examined patient and agree with resident/PA/SNM's note and plan of care.  Cheral MarkerKimberly R. Caprisha Bridgett, CNM, WHNP-BC 10/24/2014 4:20 PM

## 2014-10-24 NOTE — Anesthesia Procedure Notes (Signed)
Epidural Patient location during procedure: OB Start time: 10/24/2014 2:37 PM  Staffing Anesthesiologist: Genelda Roark Performed by: anesthesiologist   Preanesthetic Checklist Completed: patient identified, site marked, surgical consent, pre-op evaluation, timeout performed, IV checked, risks and benefits discussed and monitors and equipment checked  Epidural Patient position: sitting Prep: site prepped and draped and DuraPrep Patient monitoring: continuous pulse ox and blood pressure Approach: midline Location: L3-L4 Injection technique: LOR air  Needle:  Needle type: Tuohy  Needle gauge: 17 G Needle length: 9 cm and 9 Needle insertion depth: 6 cm Catheter type: closed end flexible Catheter size: 19 Gauge Catheter at skin depth: 11 cm Test dose: negative  Assessment Events: blood not aspirated, injection not painful, no injection resistance, negative IV test and no paresthesia  Additional Notes Discussed risk of headache, infection, bleeding, nerve injury and failed or incomplete block.  Patient voices understanding and wishes to proceed.  Epidural placed easily on first attempt.  No paresthesia.  Patient tolerated procedure well with no apparent complications.  Jasmine DecemberA. Andres Vest, MDReason for block:procedure for pain

## 2014-10-24 NOTE — Plan of Care (Signed)
Problem: Phase I Progression Outcomes Goal: Pain controlled with appropriate interventions Outcome: Completed/Met Date Met:  10/24/14 Goal: Voiding adequately Outcome: Completed/Met Date Met:  10/24/14 Goal: OOB as tolerated unless otherwise ordered Outcome: Completed/Met Date Met:  10/24/14 Goal: VS, stable, temp < 100.4 degrees F Outcome: Completed/Met Date Met:  10/24/14 Goal: Initial discharge plan identified Outcome: Completed/Met Date Met:  10/24/14  Problem: Phase II Progression Outcomes Goal: Tolerating diet Outcome: Completed/Met Date Met:  10/24/14

## 2014-10-24 NOTE — MAU Note (Signed)
Per [name], RN charge, pt to go to room 164 

## 2014-10-25 DIAGNOSIS — IMO0002 Reserved for concepts with insufficient information to code with codable children: Secondary | ICD-10-CM | POA: Diagnosis not present

## 2014-10-25 MED ORDER — IBUPROFEN 600 MG PO TABS: 600.0000 mg | ORAL_TABLET | Freq: Four times a day (QID) | ORAL | Status: DC | PRN

## 2014-10-25 NOTE — Plan of Care (Signed)
Problem: Discharge Progression Outcomes Goal: Afebrile, VS remain stable at discharge Outcome: Completed/Met Date Met:  10/25/14

## 2014-10-25 NOTE — Plan of Care (Signed)
Problem: Discharge Progression Outcomes Goal: Complications resolved/controlled Outcome: Not Applicable Date Met:  12/54/83

## 2014-10-25 NOTE — Plan of Care (Signed)
Problem: Discharge Progression Outcomes Goal: Discharge plan in place and appropriate Outcome: Completed/Met Date Met:  10/25/14

## 2014-10-25 NOTE — Discharge Summary (Signed)
Obstetric Discharge Summary Reason for Admission: onset of labor Prenatal Procedures: ultrasound Intrapartum Procedures: spontaneous vaginal delivery and w/ 871min30sec shoulder dystocia Postpartum Procedures: none Complications-Operative and Postpartum: none Eating, drinking, voiding, ambulating well.  Lochia and pain wnl.  Denies dizziness, lightheadedness, or sob. No complaints.   Hospital Course: Chelsea Price is a 23 y.o. 532P2002 female admited at 38.6wks. She progressed rapidly and then had a 341min30sec shoulder dystocia- baby moving bilateral arms normally. Her pp course has been uncomplicated.  By PPD#1 she is doing well, requests early d/c, and is deemed to have received the full benefit of her hospital stay.  Filed Vitals:   10/25/14 0608  BP: 131/57  Pulse: 100  Temp: 98.7 F (37.1 C)  Resp: 18   H/H: Lab Results  Component Value Date/Time   HGB 9.9* 10/24/2014 12:40 PM   HCT 31.0* 10/24/2014 12:40 PM    Physical Exam: General: alert, cooperative and no distress Abdomen/Uterine Fundus: Appropriately tender, non-distended, FF @ U-2 Incision: n/a Lochia: appropriate Extremities: No evidence of DVT seen on physical exam. Negative Homan's sign, no cords, calf tenderness, or significant calf/ankle edema   Discharge Diagnoses: Term Pregnancy-delivered, w/ 401min30sec shoulder dystocia  Discharge Information: Date: 06/01/2011 Activity: pelvic rest Diet: routine  Medications: PNV and Ibuprofen Breast feeding: Yes Contraception: IUD [facility] Condition: stable Instructions: refer to handout Discharge to: home  Infant: Home with mother pending peds  Follow-up Information    Follow up with [facility].   Specialty:  Obstetrics and Gynecology   Why:  4-6 weeks for your postpartum visit   Contact information:   [address] [place] [id number] [phone]      [facility]   Specialty:   Obstetrics and Gynecology   Why:  as soon as possible for your son's circumcision   Contact information:   [address] [place] [id number] [phone]      Marge DuncansBooker, Louvenia Golomb Randall, CNM, WHNP-BC 10/25/2014,6:52 AM

## 2014-10-25 NOTE — Plan of Care (Signed)
Problem: Discharge Progression Outcomes Goal: Tolerating diet Outcome: Completed/Met Date Met:  10/25/14     

## 2014-10-25 NOTE — Plan of Care (Signed)
Problem: Discharge Progression Outcomes Goal: Activity appropriate for discharge plan Outcome: Completed/Met Date Met:  10/25/14     

## 2014-10-25 NOTE — Discharge Instructions (Signed)
NO SEX UNTIL AFTER YOUR IUD IS PLACED  Postpartum Care After Vaginal Delivery After you deliver your newborn (postpartum period), the usual stay in the hospital is 24-72 hours. If there were problems with your labor or delivery, or if you have other medical problems, you might be in the hospital longer.  While you are in the hospital, you will receive help and instructions on how to care for yourself and your newborn during the postpartum period.  While you are in the hospital:  Be sure to tell your nurses if you have pain or discomfort, as well as where you feel the pain and what makes the pain worse.  If you had an incision made near your vagina (episiotomy) or if you had some tearing during delivery, the nurses may put ice packs on your episiotomy or tear. The ice packs may help to reduce the pain and swelling.  If you are breastfeeding, you may feel uncomfortable contractions of your uterus for a couple of weeks. This is normal. The contractions help your uterus get back to normal size.  It is normal to have some bleeding after delivery.  For the first 1-3 days after delivery, the flow is red and the amount may be similar to a period.  It is common for the flow to start and stop.  In the first few days, you may pass some small clots. Let your nurses know if you begin to pass large clots or your flow increases.  Do not  flush blood clots down the toilet before having the nurse look at them.  During the next 3-10 days after delivery, your flow should become more watery and pink or brown-tinged in color.  Ten to fourteen days after delivery, your flow should be a small amount of yellowish-white discharge.  The amount of your flow will decrease over the first few weeks after delivery. Your flow may stop in 6-8 weeks. Most women have had their flow stop by 12 weeks after delivery.  You should change your sanitary pads frequently.  Wash your hands thoroughly with soap and water for at  least 20 seconds after changing pads, using the toilet, or before holding or feeding your newborn.  You should feel like you need to empty your bladder within the first 6-8 hours after delivery.  In case you become weak, lightheaded, or faint, call your nurse before you get out of bed for the first time and before you take a shower for the first time.  Within the first few days after delivery, your breasts may begin to feel tender and full. This is called engorgement. Breast tenderness usually goes away within 48-72 hours after engorgement occurs. You may also notice milk leaking from your breasts. If you are not breastfeeding, do not stimulate your breasts. Breast stimulation can make your breasts produce more milk.  Spending as much time as possible with your newborn is very important. During this time, you and your newborn can feel close and get to know each other. Having your newborn stay in your room (rooming in) will help to strengthen the bond with your newborn. It will give you time to get to know your newborn and become comfortable caring for your newborn.  Your hormones change after delivery. Sometimes the hormone changes can temporarily cause you to feel sad or tearful. These feelings should not last more than a few days. If these feelings last longer than that, you should talk to your caregiver.  If desired, talk to  your caregiver about methods of family planning or contraception.  Talk to your caregiver about immunizations. Your caregiver may want you to have the following immunizations before leaving the hospital:  Tetanus, diphtheria, and pertussis (Tdap) or tetanus and diphtheria (Td) immunization. It is very important that you and your family (including grandparents) or others caring for your newborn are up-to-date with the Tdap or Td immunizations. The Tdap or Td immunization can help protect your newborn from getting ill.  Rubella immunization.  Varicella (chickenpox)  immunization.  Influenza immunization. You should receive this annual immunization if you did not receive the immunization during your pregnancy. Document Released: 09/03/2007 Document Revised: 07/31/2012 Document Reviewed: 07/03/2012 [facility] Patient Information 2015 [place], [place]. This information is not intended to replace advice given to you by your health care provider. Make sure you discuss any questions you have with your health care provider.  Breastfeeding Deciding to breastfeed is one of the best choices you can make for you and your baby. A change in hormones during pregnancy causes your breast tissue to grow and increases the number and size of your milk ducts. These hormones also allow proteins, sugars, and fats from your blood supply to make breast milk in your milk-producing glands. Hormones prevent breast milk from being released before your baby is born as well as prompt milk flow after birth. Once breastfeeding has begun, thoughts of your baby, as well as his or her sucking or crying, can stimulate the release of milk from your milk-producing glands.  BENEFITS OF BREASTFEEDING For Your Baby  Your first milk (colostrum) helps your baby's digestive system function better.   There are antibodies in your milk that help your baby fight off infections.   Your baby has a lower incidence of asthma, allergies, and sudden infant death syndrome.   The nutrients in breast milk are better for your baby than infant formulas and are designed uniquely for your baby's needs.   Breast milk improves your baby's brain development.   Your baby is less likely to develop other conditions, such as childhood obesity, asthma, or type 2 diabetes mellitus.  For You   Breastfeeding helps to create a very special bond between you and your baby.   Breastfeeding is convenient. Breast milk is always available at the correct temperature and costs nothing.   Breastfeeding helps to burn calories  and helps you lose the weight gained during pregnancy.   Breastfeeding makes your uterus contract to its prepregnancy size faster and slows bleeding (lochia) after you give birth.   Breastfeeding helps to lower your risk of developing type 2 diabetes mellitus, osteoporosis, and breast or ovarian cancer later in life. SIGNS THAT YOUR BABY IS HUNGRY Early Signs of Hunger  Increased alertness or activity.  Stretching.  Movement of the head from side to side.  Movement of the head and opening of the mouth when the corner of the mouth or cheek is stroked (rooting).  Increased sucking sounds, smacking lips, cooing, sighing, or squeaking.  Hand-to-mouth movements.  Increased sucking of fingers or hands. Late Signs of Hunger  Fussing.  Intermittent crying. Extreme Signs of Hunger Signs of extreme hunger will require calming and consoling before your baby will be able to breastfeed successfully. Do not wait for the following signs of extreme hunger to occur before you initiate breastfeeding:   Restlessness.  A loud, strong cry.   Screaming. BREASTFEEDING BASICS Breastfeeding Initiation  Find a comfortable place to sit or lie down, with your neck and  back well supported.  Place a pillow or rolled up blanket under your baby to bring him or her to the level of your breast (if you are seated). Nursing pillows are specially designed to help support your arms and your baby while you breastfeed.  Make sure that your baby's abdomen is facing your abdomen.   Gently massage your breast. With your fingertips, massage from your chest wall toward your nipple in a circular motion. This encourages milk flow. You may need to continue this action during the feeding if your milk flows slowly.  Support your breast with 4 fingers underneath and your thumb above your nipple. Make sure your fingers are well away from your nipple and your baby's mouth.   Stroke your baby's lips gently with your  finger or nipple.   When your baby's mouth is open wide enough, quickly bring your baby to your breast, placing your entire nipple and as much of the colored area around your nipple (areola) as possible into your baby's mouth.   More areola should be visible above your baby's upper lip than below the lower lip.   Your baby's tongue should be between his or her lower gum and your breast.   Ensure that your baby's mouth is correctly positioned around your nipple (latched). Your baby's lips should create a seal on your breast and be turned out (everted).  It is common for your baby to suck about 2-3 minutes in order to start the flow of breast milk. Latching Teaching your baby how to latch on to your breast properly is very important. An improper latch can cause nipple pain and decreased milk supply for you and poor weight gain in your baby. Also, if your baby is not latched onto your nipple properly, he or she may swallow some air during feeding. This can make your baby fussy. Burping your baby when you switch breasts during the feeding can help to get rid of the air. However, teaching your baby to latch on properly is still the best way to prevent fussiness from swallowing air while breastfeeding. Signs that your baby has successfully latched on to your nipple:    Silent tugging or silent sucking, without causing you pain.   Swallowing heard between every 3-4 sucks.    Muscle movement above and in front of his or her ears while sucking.  Signs that your baby has not successfully latched on to nipple:   Sucking sounds or smacking sounds from your baby while breastfeeding.  Nipple pain. If you think your baby has not latched on correctly, slip your finger into the corner of your baby's mouth to break the suction and place it between your baby's gums. Attempt breastfeeding initiation again. Signs of Successful Breastfeeding Signs from your baby:   A gradual decrease in the number of  sucks or complete cessation of sucking.   Falling asleep.   Relaxation of his or her body.   Retention of a small amount of milk in his or her mouth.   Letting go of your breast by himself or herself. Signs from you:  Breasts that have increased in firmness, weight, and size 1-3 hours after feeding.   Breasts that are softer immediately after breastfeeding.  Increased milk volume, as well as a change in milk consistency and color by the fifth day of breastfeeding.   Nipples that are not sore, cracked, or bleeding. Signs That Your Pecola Leisure is Getting Enough Milk  Wetting at least 3 diapers in a  24-hour period. The urine should be clear and pale yellow by age 82 days.  At least 3 stools in a 24-hour period by age 82 days. The stool should be soft and yellow.  At least 3 stools in a 24-hour period by age 6 days. The stool should be seedy and yellow.  No loss of weight greater than 10% of birth weight during the first 723 days of age.  Average weight gain of 4-7 ounces (113-198 g) per week after age 12 days.  Consistent daily weight gain by age 82 days, without weight loss after the age of 2 weeks. After a feeding, your baby may spit up a small amount. This is common. BREASTFEEDING FREQUENCY AND DURATION Frequent feeding will help you make more milk and can prevent sore nipples and breast engorgement. Breastfeed when you feel the need to reduce the fullness of your breasts or when your baby shows signs of hunger. This is called "breastfeeding on demand." Avoid introducing a pacifier to your baby while you are working to establish breastfeeding (the first 4-6 weeks after your baby is born). After this time you may choose to use a pacifier. Research has shown that pacifier use during the first year of a baby's life decreases the risk of sudden infant death syndrome (SIDS). Allow your baby to feed on each breast as long as he or she wants. Breastfeed until your baby is finished feeding. When  your baby unlatches or falls asleep while feeding from the first breast, offer the second breast. Because newborns are often sleepy in the first few weeks of life, you may need to awaken your baby to get him or her to feed. Breastfeeding times will vary from baby to baby. However, the following rules can serve as a guide to help you ensure that your baby is properly fed:  Newborns (babies 424 weeks of age or younger) may breastfeed every 1-3 hours.  Newborns should not go longer than 3 hours during the day or 5 hours during the night without breastfeeding.  You should breastfeed your baby a minimum of 8 times in a 24-hour period until you begin to introduce solid foods to your baby at around 836 months of age. BREAST MILK PUMPING Pumping and storing breast milk allows you to ensure that your baby is exclusively fed your breast milk, even at times when you are unable to breastfeed. This is especially important if you are going back to work while you are still breastfeeding or when you are not able to be present during feedings. Your lactation consultant can give you guidelines on how long it is safe to store breast milk.  A breast pump is a machine that allows you to pump milk from your breast into a sterile bottle. The pumped breast milk can then be stored in a refrigerator or freezer. Some breast pumps are operated by hand, while others use electricity. Ask your lactation consultant which type will work best for you. Breast pumps can be purchased, but some hospitals and breastfeeding support groups lease breast pumps on a monthly basis. A lactation consultant can teach you how to hand express breast milk, if you prefer not to use a pump.  CARING FOR YOUR BREASTS WHILE YOU BREASTFEED Nipples can become dry, cracked, and sore while breastfeeding. The following recommendations can help keep your breasts moisturized and healthy:  Avoid using soap on your nipples.   Wear a supportive bra. Although not  required, special nursing bras and tank tops are  tops are designed to allow access to your breasts for breastfeeding without taking off your entire bra or top. Avoid wearing underwire-style bras or extremely tight bras. °· Air dry your nipples for 3-4 minutes after each feeding.   °· Use only cotton bra pads to absorb leaked breast milk. Leaking of breast milk between feedings is normal.   °· Use lanolin on your nipples after breastfeeding. Lanolin helps to maintain your skin's normal moisture barrier. If you use pure lanolin, you do not need to wash it off before feeding your baby again. Pure lanolin is not toxic to your baby. You may also hand express a few drops of breast milk and gently massage that milk into your nipples and allow the milk to air dry. °In the first few weeks after giving birth, some women experience extremely full breasts (engorgement). Engorgement can make your breasts feel heavy, warm, and tender to the touch. Engorgement peaks within 3-5 days after you give birth. The following recommendations can help ease engorgement: °· Completely empty your breasts while breastfeeding or pumping. You may want to start by applying warm, moist heat (in the shower or with warm water-soaked hand towels) just before feeding or pumping. This increases circulation and helps the milk flow. If your baby does not completely empty your breasts while breastfeeding, pump any extra milk after he or she is finished. °· Wear a snug bra (nursing or regular) or tank top for 1-2 days to signal your body to slightly decrease milk production. °· Apply ice packs to your breasts, unless this is too uncomfortable for you. °· Make sure that your baby is latched on and positioned properly while breastfeeding. °If engorgement persists after 48 hours of following these recommendations, contact your health care provider or a lactation consultant. °OVERALL HEALTH CARE RECOMMENDATIONS WHILE BREASTFEEDING °· Eat healthy foods. Alternate between  meals and snacks, eating 3 of each per day. Because what you eat affects your breast milk, some of the foods may make your baby more irritable than usual. Avoid eating these foods if you are sure that they are negatively affecting your baby. °· Drink milk, fruit juice, and water to satisfy your thirst (about 10 glasses a day).   °· Rest often, relax, and continue to take your prenatal vitamins to prevent fatigue, stress, and anemia. °· Continue breast self-awareness checks. °· Avoid chewing and smoking tobacco. °· Avoid alcohol and drug use. °Some medicines that may be harmful to your baby can pass through breast milk. It is important to ask your health care provider before taking any medicine, including all over-the-counter and prescription medicine as well as vitamin and herbal supplements. °It is possible to become pregnant while breastfeeding. If birth control is desired, ask your health care provider about options that will be safe for your baby. °SEEK MEDICAL CARE IF:  °· You feel like you want to stop breastfeeding or have become frustrated with breastfeeding. °· You have painful breasts or nipples. °· Your nipples are cracked or bleeding. °· Your breasts are red, tender, or warm. °· You have a swollen area on either breast. °· You have a fever or chills. °· You have nausea or vomiting. °· You have drainage other than breast milk from your nipples. °· Your breasts do not become full before feedings by the fifth day after you give birth. °· You feel sad and depressed. °· Your baby is too sleepy to eat well. °· Your baby is having trouble sleeping.   °· Your baby is wetting less than   in a 24-hour period.  Your baby has less than 3 stools in a 24-hour period.  Your baby's skin or the white part of his or her eyes becomes yellow.   Your baby is not gaining weight by 275 days of age. SEEK IMMEDIATE MEDICAL CARE IF:   Your baby is overly tired (lethargic) and does not want to wake up and  feed.  Your baby develops an unexplained fever. Document Released: 11/06/2005 Document Revised: 11/11/2013 Document Reviewed: 04/30/2013 [facility] Patient Information 2015 [place]. This information is not intended to replace advice given to you by your health care provider. Make sure you discuss any questions you have with your health care provider.

## 2014-10-25 NOTE — Anesthesia Postprocedure Evaluation (Signed)
Anesthesia Post Note  Patient: Chelsea Price Isais  Procedure(s) Performed: * No procedures listed *  Anesthesia type: Epidural  Patient location: Mother/Baby  Post pain: Pain level controlled  Post assessment: Post-op Vital signs reviewed  Last Vitals:  Filed Vitals:   10/25/14 0608  BP: 131/57  Pulse: 100  Temp: 37.1 C  Resp: 18    Post vital signs: Reviewed  Level of consciousness:alert  Complications: No apparent anesthesia complications

## 2014-10-25 NOTE — Plan of Care (Signed)
Problem: Phase II Progression Outcomes Goal: Pain controlled on oral analgesia Outcome: Completed/Met Date Met:  10/25/14 Goal: Progress activity as tolerated unless otherwise ordered Outcome: Completed/Met Date Met:  10/25/14 Goal: Afebrile, VS remain stable Outcome: Completed/Met Date Met:  10/25/14

## 2014-10-25 NOTE — Plan of Care (Signed)
Problem: Discharge Progression Outcomes Goal: Barriers To Progression Addressed/Resolved Outcome: Not Applicable Date Met:  48/54/62

## 2014-10-28 ENCOUNTER — Encounter: Payer: Medicaid Other | Admitting: Women's Health

## 2014-10-28 LAB — TYPE AND SCREEN
ABO/RH(D): A NEG
ANTIBODY SCREEN: POSITIVE
DAT, IGG: NEGATIVE
UNIT DIVISION: 0
Unit division: 0

## 2014-11-02 ENCOUNTER — Other Ambulatory Visit: Payer: Self-pay | Admitting: *Deleted

## 2014-11-03 MED ORDER — IBUPROFEN 600 MG PO TABS: 600.0000 mg | ORAL_TABLET | Freq: Four times a day (QID) | ORAL | Status: DC | PRN

## 2014-11-16 ENCOUNTER — Other Ambulatory Visit: Payer: Self-pay | Admitting: Obstetrics and Gynecology

## 2014-11-26 ENCOUNTER — Ambulatory Visit (INDEPENDENT_AMBULATORY_CARE_PROVIDER_SITE_OTHER): Payer: Medicaid Other | Admitting: Advanced Practice Midwife

## 2014-11-26 ENCOUNTER — Encounter: Payer: Self-pay | Admitting: Advanced Practice Midwife

## 2014-11-26 NOTE — Progress Notes (Signed)
Chelsea Price is a 24 y.o. who presents for a postpartum visit. She is 4 weeks postpartum following a spontaneous vaginal delivery. I have fully reviewed the prenatal and intrapartum course. The delivery was at 38.6 gestational weeks.  Anesthesia: epidural. Postpartum course has been uneventful. Baby's course has been uneventful. Baby is feeding by bottle. Bleeding: thin lochia. Bowel function is normal. Bladder function is normal. Patient is not sexually active. Contraception method is none. Postpartum depression screening: equivocal.  Her nurse from [facility] is referring her to a counselor.  Denies SI/HI. Good support system  Current outpatient prescriptions: acetaminophen (TYLENOL) 325 MG tablet, Take 650 mg by mouth every 6 (six) hours as needed for mild pain., Disp: , Rfl: ;  ibuprofen (ADVIL,MOTRIN) 600 MG tablet, Take 1 tablet (600 mg total) by mouth every 6 (six) hours as needed for mild pain, moderate pain or cramping. (Patient not taking: Reported on 11/26/2014), Disp: 30 tablet, Rfl: 0 Pediatric Multiple Vit-C-FA (FLINSTONES GUMMIES OMEGA-3 DHA) CHEW, Chew 2 tablets by mouth daily. Take 2 daily, Disp: , Rfl:   Review of Systems   Constitutional: Negative for fever and chills Eyes: Negative for visual disturbances Respiratory: Negative for shortness of breath, dyspnea Cardiovascular: Negative for chest pain or palpitations  Gastrointestinal: Negative for vomiting, diarrhea and constipation Genitourinary: Negative for dysuria and urgency Musculoskeletal: Negative for back pain, joint pain, myalgias  Neurological: Negative for dizziness and headaches   Objective:     Filed Vitals:   11/26/14 1021  BP: 140/100   General:  alert, cooperative and no distress   Breasts:  negative  Lungs: clear to auscultation bilaterally  Heart:  regular rate and rhythm  Abdomen: Soft, nontender   Vulva:  normal  Vagina: normal vagina  Cervix:  closed  Corpus: Well involuted     Rectal Exam: no  hemorrhoids        Assessment:    normal postpartum exam.  Plan:    1. Contraception: IUD 2. Follow up in: 1 week for IUD or as needed.

## 2014-12-02 ENCOUNTER — Ambulatory Visit (INDEPENDENT_AMBULATORY_CARE_PROVIDER_SITE_OTHER): Payer: Medicaid Other | Admitting: Advanced Practice Midwife

## 2014-12-02 ENCOUNTER — Encounter: Payer: Self-pay | Admitting: Advanced Practice Midwife

## 2014-12-02 VITALS — BP 120/74 | Ht 62.0 in | Wt 167.0 lb

## 2014-12-02 DIAGNOSIS — Z30430 Encounter for insertion of intrauterine contraceptive device: Secondary | ICD-10-CM | POA: Insufficient documentation

## 2014-12-02 DIAGNOSIS — Z3202 Encounter for pregnancy test, result negative: Secondary | ICD-10-CM

## 2014-12-02 LAB — POCT URINE PREGNANCY: Preg Test, Ur: NEGATIVE

## 2014-12-02 NOTE — Progress Notes (Signed)
Chelsea Price is a 24 y.o. year old Caucasian female Gravida 1 Para 1  who presents for placement of a Lyletta IUD. She has not had sex since delivery and her pregnancy test today is negative.    The risks and benefits of the method and placement have been thouroughly reviewed with the patient and all questions were answered.  Specifically the patient is aware of failure rate of 11/998, expulsion of the IUD and of possible perforation.  The patient is aware of irregular bleeding due to the method and understands the incidence of irregular bleeding diminishes with time. She is also aware that as of today, the [name] is approved for 3 years, with the expectation (although not guarantee) that it will receive 5 year, and most likely 7 year, approval).  Time out was performed.  A Graves speculum was placed.  The cervix was prepped using Betadine. The uterus was found to be neutral and it sounded to 7 cm.  The cervix was grasped with a tenaculum and the IUD was inserted to 7 cm.  It was pulled back 1 cm and the IUD was disengaged.  The strings were trimmed to 3 cm.  Sonogram was performed and the proper placement of the IUD was verified.  The patient was instructed on signs and symptoms of infection and to check for the strings after each menses or each month.  The patient is to refrain from intercourse for 3 days.  The patient is scheduled for a return appointment after her first menses or 4 weeks.  CRESENZO-DISHMAN,[name] 12/02/2014 9:37 AM

## 2014-12-30 ENCOUNTER — Ambulatory Visit: Payer: Self-pay | Admitting: Advanced Practice Midwife

## 2016-06-21 ENCOUNTER — Encounter (HOSPITAL_COMMUNITY): Payer: Self-pay

## 2016-06-21 ENCOUNTER — Emergency Department (HOSPITAL_COMMUNITY): Payer: Medicaid Other

## 2016-06-21 ENCOUNTER — Emergency Department (HOSPITAL_COMMUNITY)
Admission: EM | Admit: 2016-06-21 | Discharge: 2016-06-21 | Disposition: A | Discharge status: Home / Self Care | Payer: Medicaid Other | Attending: Emergency Medicine | Admitting: Emergency Medicine

## 2016-06-21 DIAGNOSIS — Z79899 Other long term (current) drug therapy: Secondary | ICD-10-CM | POA: Diagnosis not present

## 2016-06-21 DIAGNOSIS — R110 Nausea: Secondary | ICD-10-CM | POA: Diagnosis not present

## 2016-06-21 DIAGNOSIS — M545 Low back pain: Secondary | ICD-10-CM | POA: Insufficient documentation

## 2016-06-21 DIAGNOSIS — R109 Unspecified abdominal pain: Secondary | ICD-10-CM | POA: Insufficient documentation

## 2016-06-21 DIAGNOSIS — Z791 Long term (current) use of non-steroidal anti-inflammatories (NSAID): Secondary | ICD-10-CM | POA: Diagnosis not present

## 2016-06-21 LAB — PREGNANCY, URINE: Preg Test, Ur: NEGATIVE

## 2016-06-21 LAB — URINALYSIS, ROUTINE W REFLEX MICROSCOPIC
BILIRUBIN URINE: NEGATIVE
Glucose, UA: NEGATIVE mg/dL
KETONES UR: NEGATIVE mg/dL
Leukocytes, UA: NEGATIVE
Nitrite: NEGATIVE
PH: 6 (ref 5.0–8.0)
Protein, ur: NEGATIVE mg/dL
Specific Gravity, Urine: >1.03 — ABNORMAL HIGH (ref 1.005–1.030)

## 2016-06-21 LAB — URINE MICROSCOPIC-ADD ON

## 2016-06-21 MED ORDER — KETOROLAC TROMETHAMINE 60 MG/2ML IM SOLN
60.0000 mg | Freq: Once | INTRAMUSCULAR | Status: AC
  Administered 2016-06-21: 60 mg via INTRAMUSCULAR
  Filled 2016-06-21: qty 2

## 2016-06-21 MED ORDER — METHOCARBAMOL 500 MG PO TABS: 1000.0000 mg | ORAL_TABLET | Freq: Four times a day (QID) | ORAL | 0 refills | Status: DC | PRN

## 2016-06-21 MED ORDER — NAPROXEN 250 MG PO TABS: 250.0000 mg | ORAL_TABLET | Freq: Two times a day (BID) | ORAL | 0 refills | Status: DC | PRN

## 2016-06-21 NOTE — Discharge Instructions (Signed)
Take the prescriptions as directed.  Take over the counter tylenol, as directed on packaging, as needed for discomfort. Apply moist heat or ice to the area(s) of discomfort, for 15 minutes at a time, several times per day for the next few days.  Do not fall asleep on a heating or ice pack.  Call your regular medical doctor tomorrow to schedule a follow up appointment this week.  Return to the Emergency Department immediately if worsening.

## 2016-06-21 NOTE — ED Provider Notes (Signed)
[facility]-EMERGENCY DEPT Provider Note   CSN: [id number] Arrival date & time: 06/21/16  1333  First Provider Contact:  None       History   Chief Complaint Chief Complaint  Patient presents with  . Flank Pain  . Nausea    HPI Chelsea Price is a 25 y.o. female.  HPI  Pt was seen at 1405. Per pt, c/o gradual onset and persistence of constant constant bilateral low back "pain" since yesterday.  Pain worsens with palpation of the area and body position changes. Denies incont/retention of bowel or bladder, no saddle anesthesia, no focal motor weakness, no tingling/numbness in extremities, no fevers, no injury, no abd pain, no CP/SOB, no dysuria/hematuria, no vaginal bleeding/discharge.  The symptoms have been associated with no other complaints.     History reviewed. No pertinent past medical history.  Patient Active Problem List   Diagnosis Date Noted  . Encounter for IUD insertion 12/02/2014  . Shoulder dystocia 10/25/2014  . Rh negative state in antepartum period 08/17/2014    Past Surgical History:  Procedure Laterality Date  . WISDOM TOOTH EXTRACTION    . WISDOM TOOTH EXTRACTION      OB History    Gravida Para Term Preterm AB Living   2 2 2     2    SAB TAB Ectopic Multiple Live Births         0 2       Home Medications    Prior to Admission medications   Medication Sig Start Date End Date Taking? Authorizing Provider  acetaminophen (TYLENOL) 325 MG tablet Take 1,625 mg by mouth every 6 (six) hours as needed for mild pain.    Yes Historical Provider, MD  ibuprofen (ADVIL,MOTRIN) 200 MG tablet Take 1,000 mg by mouth every 6 (six) hours as needed for moderate pain.   Yes Historical Provider, MD    Family History Family History  Problem Relation Age of Onset  . COPD Mother   . Hypertension Mother   . COPD Maternal Grandmother   . Hypertension Maternal Grandmother     Social History Social History  Substance Use Topics  . Smoking status: Never Smoker  .  Smokeless tobacco: Never Used  . Alcohol use No     Allergies   Latex   Review of Systems Review of Systems ROS: Statement: All systems negative except as marked or noted in the HPI; Constitutional: Negative for fever and chills. ; ; Eyes: Negative for eye pain, redness and discharge. ; ; ENMT: Negative for ear pain, hoarseness, nasal congestion, sinus pressure and sore throat. ; ; Cardiovascular: Negative for chest pain, palpitations, diaphoresis, dyspnea and peripheral edema. ; ; Respiratory: Negative for cough, wheezing and stridor. ; ; Gastrointestinal: Negative for nausea, vomiting, diarrhea, abdominal pain, blood in stool, hematemesis, jaundice and rectal bleeding. . ; ; Genitourinary: +flank pain. Negative for dysuria and hematuria. ; ; GYN:  No pelvic pain, no vaginal bleeding, no vaginal discharge, no vulvar pain. ;; Musculoskeletal: +LBP. Negative for neck pain. Negative for swelling and trauma.; ; Skin: Negative for pruritus, rash, abrasions, blisters, bruising and skin lesion.; ; Neuro: Negative for headache, lightheadedness and neck stiffness. Negative for weakness, altered level of consciousness, altered mental status, extremity weakness, paresthesias, involuntary movement, seizure and syncope.       Physical Exam Updated Vital Signs BP 159/82 (BP Location: Left Arm)   Pulse 109   Temp 98.9 F (37.2 C) (Oral)   Resp 18   Ht 5'  2" (1.575 m)   Wt 165 lb (74.8 kg)   LMP 05/21/2016   SpO2 100%   BMI 30.18 kg/m   Physical Exam 1410: Physical examination:  Nursing notes reviewed; Vital signs and O2 SAT reviewed;  Constitutional: Well developed, Well nourished, Well hydrated, In no acute distress; Head:  Normocephalic, atraumatic; Eyes: EOMI, PERRL, No scleral icterus; ENMT: Mouth and pharynx normal, Mucous membranes moist; Neck: Supple, Full range of motion, No lymphadenopathy; Cardiovascular: Regular rate and rhythm, No murmur, rub, or gallop; Respiratory: Breath sounds clear  & equal bilaterally, No rales, rhonchi, wheezes.  Speaking full sentences with ease, Normal respiratory effort/excursion; Chest: Nontender, Movement normal; Abdomen: Soft, Nontender, Nondistended, Normal bowel sounds; Genitourinary: No CVA tenderness; Spine:  No midline CS, TS, LS tenderness. +mild TTP R>L lumbar paraspinal muscles.;;;; Extremities: Pulses normal, No tenderness, No edema, No calf edema or asymmetry.; Neuro: AA&Ox3, Major CN grossly intact.  Speech clear. No gross focal motor or sensory deficits in extremities.; Skin: Color normal, Warm, Dry.   ED Treatments / Results  Labs (all labs ordered are listed, but only abnormal results are displayed)   EKG  EKG Interpretation None       Radiology   Procedures Procedures (including critical care time)  Medications Ordered in ED Medications - No data to display   Initial Impression / Assessment and Plan / ED Course  I have reviewed the triage vital signs and the nursing notes.  Pertinent labs & imaging results that were available during my care of the patient were reviewed by me and considered in my medical decision making (see chart for details).  MDM Reviewed: vitals, nursing note and previous chart Reviewed previous: labs Interpretation: CT scan and labs   Results for orders placed or performed during the hospital encounter of 06/21/16  Pregnancy, urine  Result Value Ref Range   Preg Test, Ur NEGATIVE NEGATIVE  Urinalysis, Routine w reflex microscopic  Result Value Ref Range   Color, Urine YELLOW YELLOW   APPearance CLEAR CLEAR   Specific Gravity, Urine >1.030 (H) 1.005 - 1.030   pH 6.0 5.0 - 8.0   Glucose, UA NEGATIVE NEGATIVE mg/dL   Hgb urine dipstick TRACE (A) NEGATIVE   Bilirubin Urine NEGATIVE NEGATIVE   Ketones, ur NEGATIVE NEGATIVE mg/dL   Protein, ur NEGATIVE NEGATIVE mg/dL   Nitrite NEGATIVE NEGATIVE   Leukocytes, UA NEGATIVE NEGATIVE  Urine microscopic-add on  Result Value Ref Range    Squamous Epithelial / LPF TOO NUMEROUS TO COUNT (A) NONE SEEN   WBC, UA 0-5 0 - 5 WBC/hpf   RBC / HPF 0-5 0 - 5 RBC/hpf   Bacteria, UA FEW (A) NONE SEEN   Ct Renal Stone Study Result Date: 06/21/2016 CLINICAL DATA:  RIGHT flank pain and nausea since 06/20/2016. EXAM: CT ABDOMEN AND PELVIS WITHOUT CONTRAST TECHNIQUE: Multidetector CT imaging of the abdomen and pelvis was performed following the standard protocol without IV contrast. COMPARISON:  None. FINDINGS: Lower chest:  No acute findings. Hepatobiliary: No mass visualized on this un-enhanced exam. Pancreas: No mass or inflammatory process identified on this un-enhanced exam. Spleen: Within normal limits in size. Adrenals/Urinary Tract: No evidence of urolithiasis or hydronephrosis. No definite mass visualized on this un-enhanced exam. Stomach/Bowel: No evidence of obstruction, inflammatory process, or abnormal fluid collections. Vascular/Lymphatic: No pathologically enlarged lymph nodes. No evidence of abdominal aortic aneurysm. Reproductive: No mass or other significant abnormality. IUD is located anatomically within uterus. Other: None. Musculoskeletal:  No suspicious bone lesions identified. Electronically  Signed   By: Elsie Stain M.D.   On: 06/21/2016 16:07    1625: Udip contaminated. CT reassuring. Tx symptomatically at this time. Dx and testing d/w pt.  Questions answered.  Verb understanding, agreeable to d/c home with outpt f/u.    Final Clinical Impressions(s) / ED Diagnoses   Final diagnoses:  None    New Prescriptions New Prescriptions   No medications on file     Samuel Jester, DO 06/25/16 1405

## 2016-06-21 NOTE — ED Triage Notes (Signed)
Patient complains of upper abdominal pain with nausea that radiates to bilateral legs. Also complains of lower back pain. Denies urinary symptoms.

## 2017-02-09 ENCOUNTER — Ambulatory Visit (INDEPENDENT_AMBULATORY_CARE_PROVIDER_SITE_OTHER): Payer: Medicaid Other | Admitting: Obstetrics & Gynecology

## 2017-02-09 ENCOUNTER — Encounter: Payer: Self-pay | Admitting: Obstetrics & Gynecology

## 2017-02-09 VITALS — BP 126/84 | HR 107 | Wt 189.0 lb

## 2017-02-09 DIAGNOSIS — R102 Pelvic and perineal pain: Secondary | ICD-10-CM

## 2017-02-09 NOTE — Progress Notes (Signed)
Chief Complaint  Patient presents with  . Abdominal Cramping    Blood pressure 126/84, pulse (!) 107, weight 189 lb (85.7 kg).  26 y.o. [id number] No LMP recorded. The current method of family planning is IUD.  Outpatient Encounter Prescriptions as of 02/09/2017  Medication Sig  . acetaminophen (TYLENOL) 325 MG tablet Take 1,625 mg by mouth every 6 (six) hours as needed for mild pain.   Marland Kitchen. ibuprofen (ADVIL,MOTRIN) 200 MG tablet Take 1,000 mg by mouth every 6 (six) hours as needed for moderate pain.  . [DISCONTINUED] methocarbamol (ROBAXIN) 500 MG tablet Take 2 tablets (1,000 mg total) by mouth 4 (four) times daily as needed for muscle spasms (muscle spasm/pain).  . [DISCONTINUED] naproxen (NAPROSYN) 250 MG tablet Take 1 tablet (250 mg total) by mouth 2 (two) times daily as needed for mild pain or moderate pain (take with food).   No facility-administered encounter medications on file as of 02/09/2017.     Subjective The patient presents stating she and her cousin were horse playing and her accidentally hit her in the right lower abdomen Since then she has been cramping and is concerned about her IUD placement No fever chills No bleeding Has sporadic irregular bleeding  Objective POC sonogram reveals IUD to be in proper location, small collection of blood in lower uterine segment No other uterine pathology noted Cervix normal strings visible Uterus is non tender  Pertinent ROS No burning with urination, frequency or urgency No nausea, vomiting or diarrhea Nor fever chills or other constitutional symptoms   Labs or studies Per note above    Impression Diagnoses this Encounter::   ICD-9-CM ICD-10-CM   1. Pelvic cramping 625.9 R10.2     Established relevant diagnosis(es):   Plan/Recommendations: No orders of the defined types were placed in this encounter.   Labs or Scans Ordered: No orders of the defined types were placed in this  encounter.   Management:: IUD in proper location which is mostly what was concerning patient No further treatment or follow up needed  Follow up Return if symptoms worsen or fail to improve.        Face to face time:  15 minutes  Greater than 50% of the visit time was spent in counseling and coordination of care with the patient.  The summary and outline of the counseling and care coordination is summarized in the note above.   All questions were answered.  History reviewed. No pertinent past medical history.  Past Surgical History:  Procedure Laterality Date  . WISDOM TOOTH EXTRACTION    . WISDOM TOOTH EXTRACTION      OB History    Gravida Para Term Preterm AB Living   2 2 2     2    SAB TAB Ectopic Multiple Live Births         0 2      Allergies  Allergen Reactions  . Latex Hives    Social History   Social History  . Marital status: Single    Spouse name: N/A  . Number of children: N/A  . Years of education: N/A   Social History Main Topics  . Smoking status: Never Smoker  . Smokeless tobacco: Never Used  . Alcohol use No  . Drug use: No  . Sexual activity: Yes    Birth control/ protection: None, IUD   Other Topics Concern  . None   Social History Narrative  . None    Family History  Problem Relation Age of Onset  . COPD Mother   . Hypertension Mother   . COPD Maternal Grandmother   . Hypertension Maternal Grandmother

## 2017-07-17 IMAGING — CT CT RENAL STONE PROTOCOL
2 of 4 series · 16 of 46 positions shown, 18 images · non-contrast
Comparison: None.

CLINICAL DATA: RIGHT flank pain and nausea since 06/20/2016.

EXAM:
CT ABDOMEN AND PELVIS WITHOUT CONTRAST
TECHNIQUE: Multidetector CT imaging of the abdomen and pelvis was performed
following the standard protocol without IV contrast.

[Series 2: routine abd pel with · axial · 0.76mm/px · z∈[+38,+484]mm · 13 of 99 slices shown, 15 images]
[im 5/99  soft-tissue]
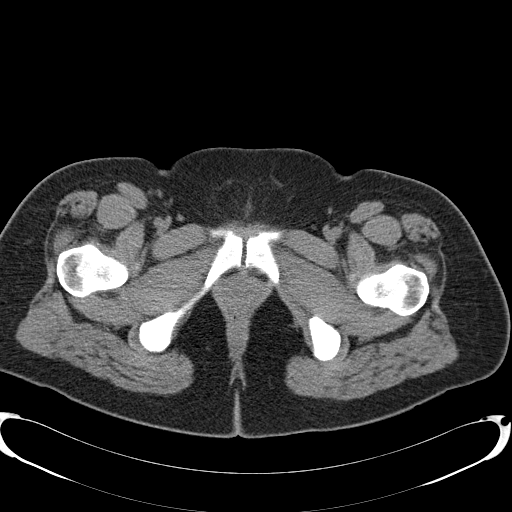
[im 5/99  bone]
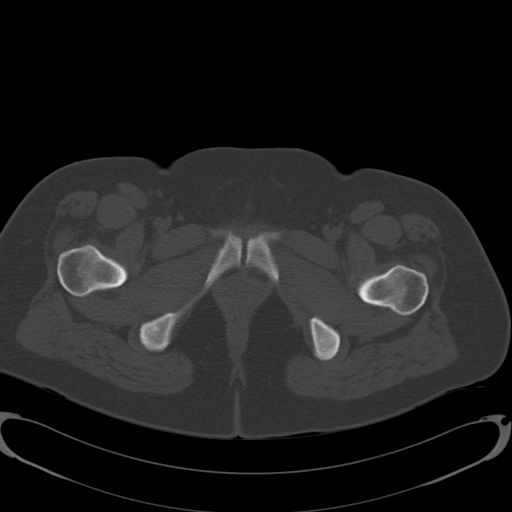
[im 13/99  soft-tissue]
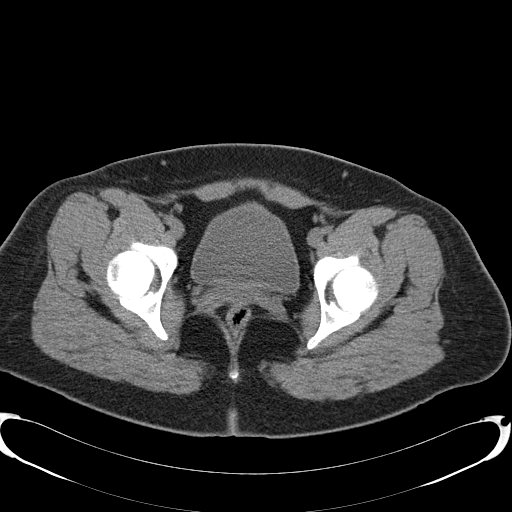
[im 22/99  soft-tissue]
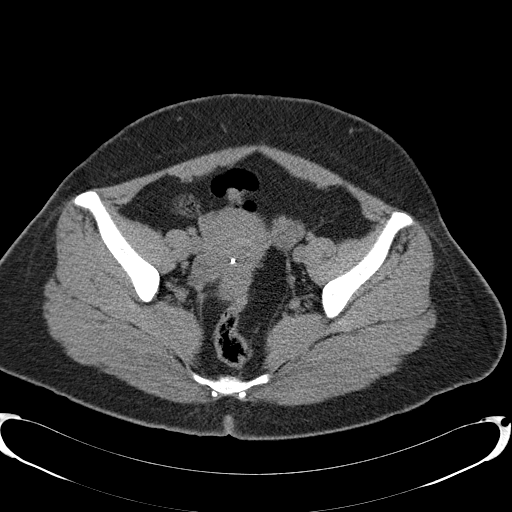
[im 26/99  soft-tissue]
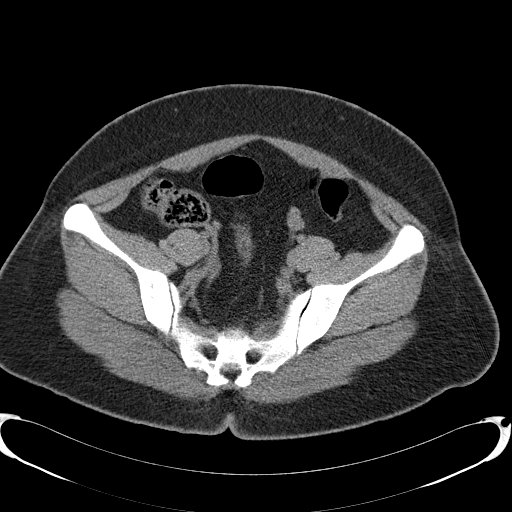
[im 35/99  soft-tissue]
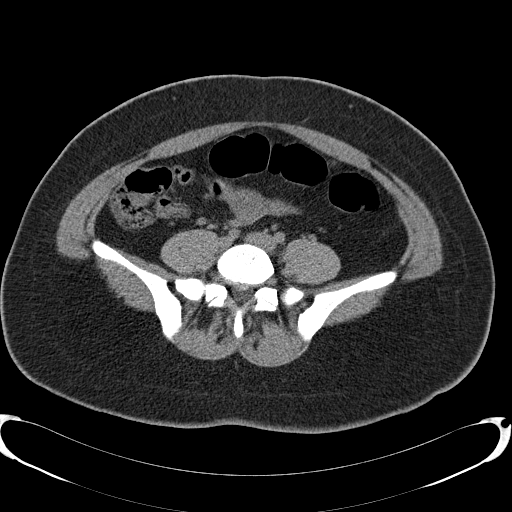
[im 43/99  soft-tissue]
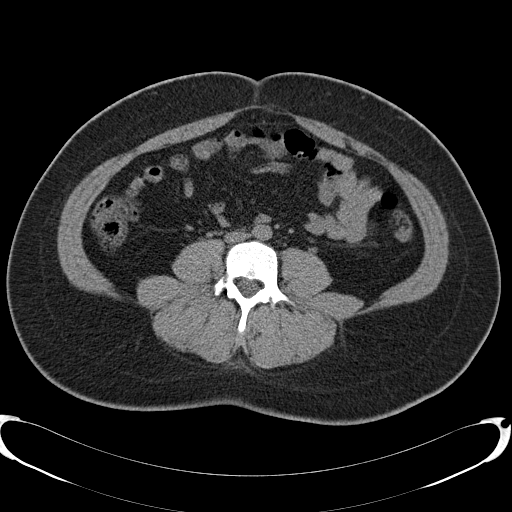
[im 52/99  soft-tissue]
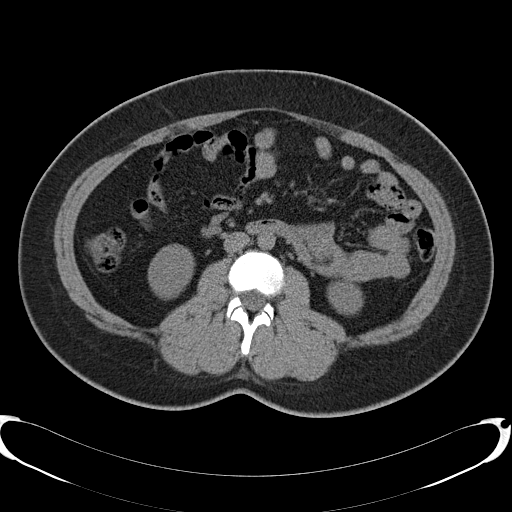
[im 56/99  soft-tissue]
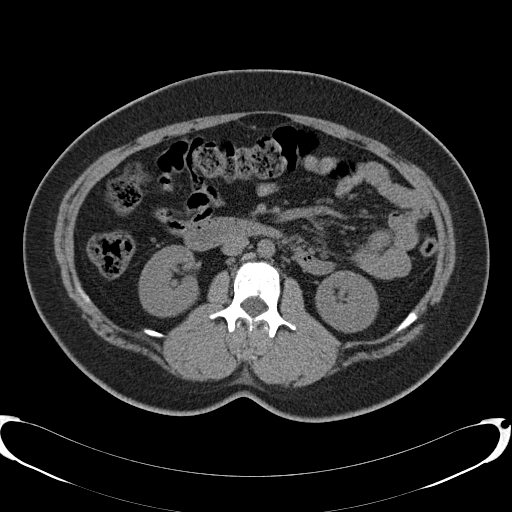
[im 64/99  soft-tissue]
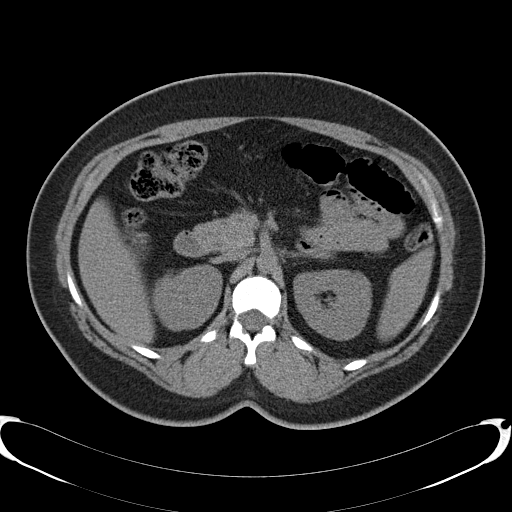
[im 64/99  bone]
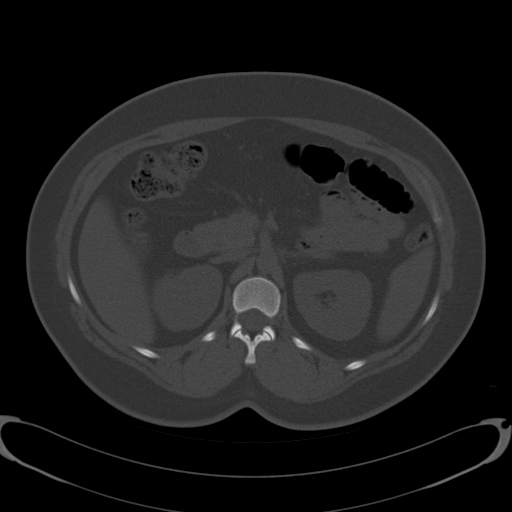
[im 73/99  soft-tissue]
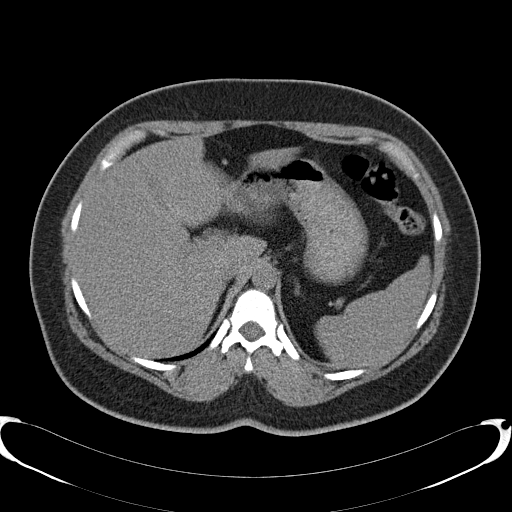
[im 77/99  soft-tissue]
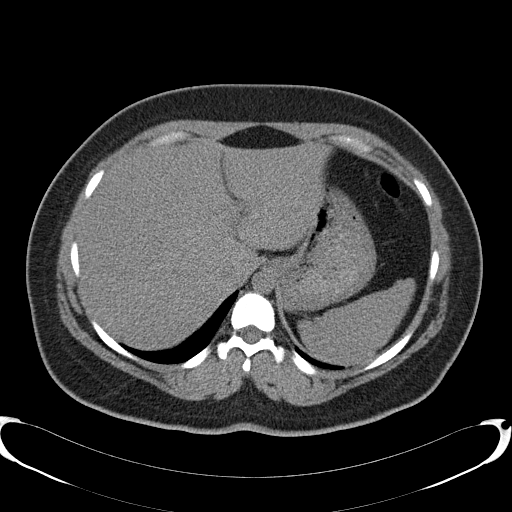
[im 86/99  soft-tissue]
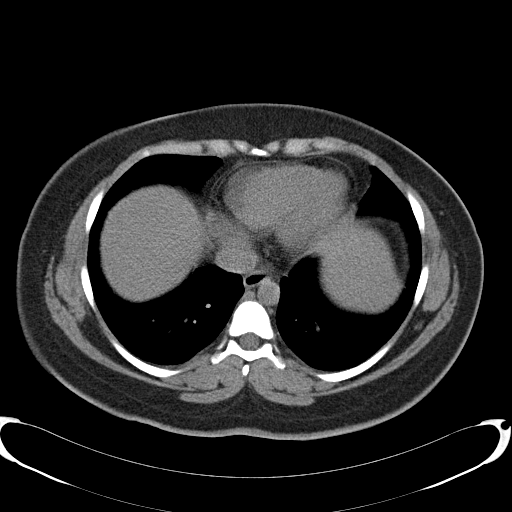
[im 94/99  soft-tissue]
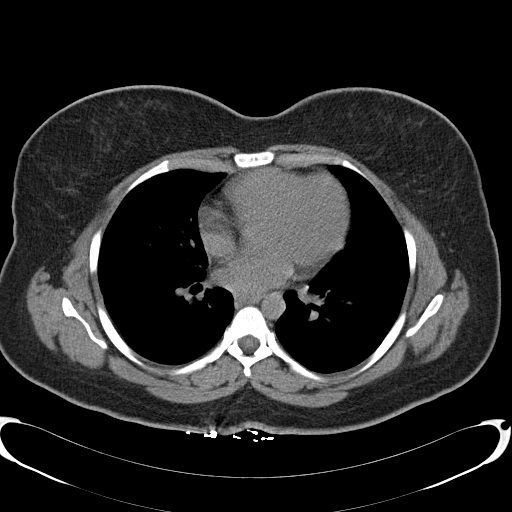

[Series 4: coronal · coronal · 0.73mm/px · 3 of 141 slices shown]
[im 47/141  soft-tissue]
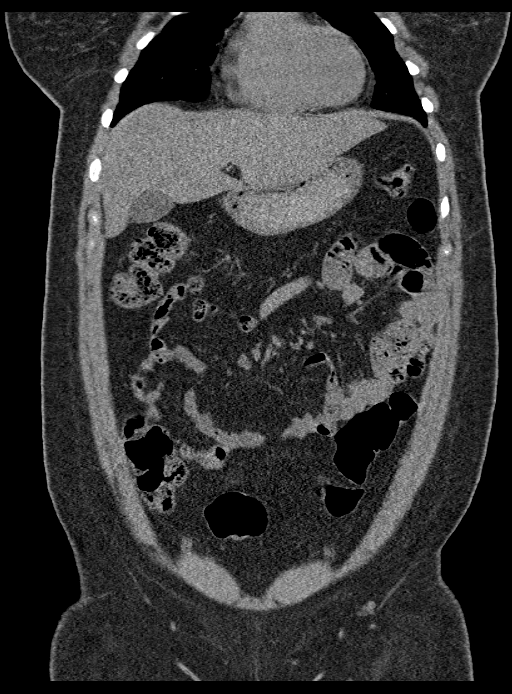
[im 63/141  soft-tissue]
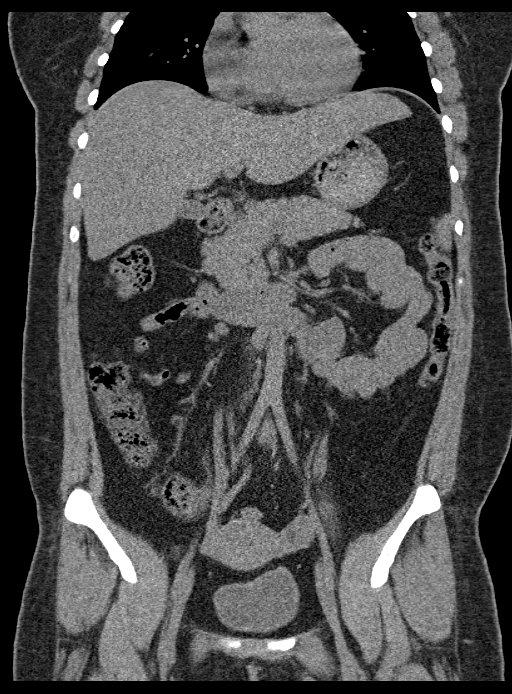
[im 78/141  soft-tissue]
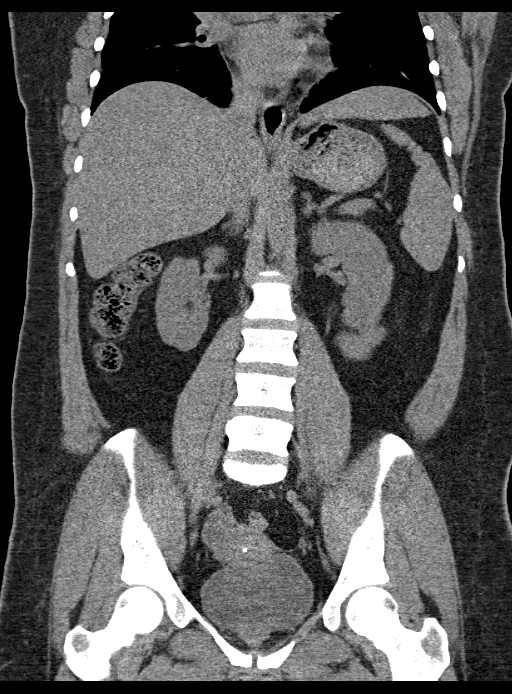

[16 of 46 positions shown; findings below may reference images not displayed]

FINDINGS: Lower chest:  No acute findings.

Hepatobiliary: No mass visualized on this un-enhanced exam.

Pancreas: No mass or inflammatory process identified on this
un-enhanced exam.

Spleen: Within normal limits in size.

Adrenals/Urinary Tract: No evidence of urolithiasis or
hydronephrosis. No definite mass visualized on this un-enhanced
exam.

Stomach/Bowel: No evidence of obstruction, inflammatory process, or
abnormal fluid collections.

Vascular/Lymphatic: No pathologically enlarged lymph nodes. No
evidence of abdominal aortic aneurysm.

Reproductive: No mass or other significant abnormality. IUD is
located anatomically within uterus.

Other: None.

Musculoskeletal:  No suspicious bone lesions identified.

## 2018-03-31 ENCOUNTER — Other Ambulatory Visit: Payer: Self-pay

## 2018-03-31 ENCOUNTER — Encounter (HOSPITAL_COMMUNITY): Payer: Self-pay | Admitting: *Deleted

## 2018-03-31 DIAGNOSIS — N390 Urinary tract infection, site not specified: Secondary | ICD-10-CM | POA: Insufficient documentation

## 2018-03-31 DIAGNOSIS — Z91040 Latex allergy status: Secondary | ICD-10-CM | POA: Insufficient documentation

## 2018-03-31 DIAGNOSIS — R300 Dysuria: Secondary | ICD-10-CM | POA: Diagnosis present

## 2018-03-31 LAB — PREGNANCY, URINE: PREG TEST UR: NEGATIVE

## 2018-03-31 NOTE — ED Triage Notes (Signed)
Pt c/o pain with urination that started 3 days ago, bilateral upper abd pain for the past week as well,

## 2018-04-01 ENCOUNTER — Emergency Department (HOSPITAL_COMMUNITY)
Admission: EM | Admit: 2018-04-01 | Discharge: 2018-04-01 | Disposition: A | Discharge status: Home / Self Care | Payer: Medicaid Other | Attending: Emergency Medicine | Admitting: Emergency Medicine

## 2018-04-01 DIAGNOSIS — N390 Urinary tract infection, site not specified: Secondary | ICD-10-CM

## 2018-04-01 LAB — URINALYSIS, ROUTINE W REFLEX MICROSCOPIC
Bilirubin Urine: NEGATIVE
Glucose, UA: NEGATIVE mg/dL
Ketones, ur: NEGATIVE mg/dL
Nitrite: NEGATIVE
PROTEIN: 100 mg/dL — AB
Specific Gravity, Urine: 1.032 — ABNORMAL HIGH (ref 1.005–1.030)
pH: 5 (ref 5.0–8.0)

## 2018-04-01 MED ORDER — CEPHALEXIN 500 MG PO CAPS
500.0000 mg | ORAL_CAPSULE | Freq: Once | ORAL | Status: AC
  Administered 2018-04-01: 500 mg via ORAL
  Filled 2018-04-01: qty 1

## 2018-04-01 MED ORDER — CEPHALEXIN 500 MG PO CAPS: 500.0000 mg | ORAL_CAPSULE | Freq: Four times a day (QID) | ORAL | 0 refills | Status: DC

## 2018-04-01 NOTE — Discharge Instructions (Signed)
Return if any problems.

## 2018-04-01 NOTE — ED Provider Notes (Addendum)
[facility] EMERGENCY DEPARTMENT Provider Note   CSN: [id number] Arrival date & time: 03/31/18  2235     History   Chief Complaint Chief Complaint  Patient presents with  . Dysuria    HPI Chelsea Price is a 27 y.o. female.  The history is provided by the patient. No language interpreter was used.  Dysuria   This is a new problem. The problem occurs every urination. The problem has been gradually worsening. The pain is moderate. There has been no fever. She is sexually active. There is no history of pyelonephritis. Pertinent negatives include no chills and no sweats. She has tried nothing for the symptoms. Her past medical history does not include kidney stones.    History reviewed. No pertinent past medical history.  Patient Active Problem List   Diagnosis Date Noted  . Encounter for IUD insertion 12/02/2014  . Shoulder dystocia 10/25/2014  . Rh negative state in antepartum period 08/17/2014    Past Surgical History:  Procedure Laterality Date  . WISDOM TOOTH EXTRACTION    . WISDOM TOOTH EXTRACTION       OB History    Gravida  2   Para  2   Term  2   Preterm      AB      Living  2     SAB      TAB      Ectopic      Multiple  0   Live Births  2            Home Medications    Prior to Admission medications   Medication Sig Start Date End Date Taking? Authorizing Provider  levonorgestrel (MIRENA) 20 MCG/24HR IUD 1 each by Intrauterine route once.   Yes [provider]  acetaminophen (TYLENOL) 325 MG tablet Take 1,625 mg by mouth every 6 (six) hours as needed for mild pain.     [provider]  cephALEXin (KEFLEX) 500 MG capsule Take 1 capsule (500 mg total) by mouth 4 (four) times daily. 04/01/18   Elson Areas, PA-C  ibuprofen (ADVIL,MOTRIN) 200 MG tablet Take 1,000 mg by mouth every 6 (six) hours as needed for moderate pain.    [provider]    Family History Family History  Problem Relation Age of Onset   . COPD Mother   . Hypertension Mother   . COPD Maternal Grandmother   . Hypertension Maternal Grandmother     Social History Social History   Tobacco Use  . Smoking status: Never Smoker  . Smokeless tobacco: Never Used  Substance Use Topics  . Alcohol use: No  . Drug use: No     Allergies   Latex   Review of Systems Review of Systems  Constitutional: Negative for chills.  Genitourinary: Positive for dysuria.  All other systems reviewed and are negative.    Physical Exam Updated Vital Signs BP (!) 145/82   Pulse 90   Temp 98.4 F (36.9 C) (Oral)   Resp 18   Ht  (1.575 m)   Wt 74.8 kg (165 lb)   SpO2 99%   BMI 30.18 kg/m   Physical Exam  Constitutional: She appears well-developed and well-nourished.  HENT:  Head: Normocephalic.  Right Ear: External ear normal.  Cardiovascular: Normal rate.  Pulmonary/Chest: Effort normal.  Abdominal: Soft. There is no tenderness.  No flank pain,  No suprapubic tenderness   Musculoskeletal: Normal range of motion.  Neurological: She is alert.  Skin: Skin is warm.  Psychiatric: She has a normal mood and affect.  Nursing note and vitals reviewed.    ED Treatments / Results  Labs (all labs ordered are listed, but only abnormal results are displayed) Labs Reviewed  URINALYSIS, ROUTINE W REFLEX MICROSCOPIC - Abnormal; Notable for the following components:      Result Value   APPearance HAZY (*)    Specific Gravity, Urine 1.032 (*)    Hgb urine dipstick LARGE (*)    Protein, ur 100 (*)    Leukocytes, UA SMALL (*)    RBC / HPF >50 (*)    WBC, UA >50 (*)    Bacteria, UA RARE (*)    Non Squamous Epithelial 0-5 (*)    All other components within normal limits  PREGNANCY, URINE    EKG None  Radiology No results found.  Procedures Procedures (including critical care time)  Medications Ordered in ED Medications  cephALEXin (KEFLEX) capsule 500 mg (500 mg Oral Given 04/01/18 0057)     Initial  Impression / Assessment and Plan / ED Course  I have reviewed the triage vital signs and the nursing notes.  Pertinent labs & imaging results that were available during my care of the patient were reviewed by me and considered in my medical decision making (see chart for details).     MDM  Urine shows wbc's greater than 50  Pt counseled on urinary tract infection.  Pt given a dose of keflex here.   rx for keflex   Final Clinical Impressions(s) / ED Diagnoses   Final diagnoses:  Urinary tract infection without hematuria, site unspecified    ED Discharge Orders        Ordered    cephALEXin (KEFLEX) 500 MG capsule  4 times daily     04/01/18 0052    An After Visit Summary was printed and given to the patient.    Elson Areas, PA-C 04/01/18 1739    Elson Areas, PA-C 04/08/18 1731    Dione Booze, MD 04/09/18 2300

## 2018-11-05 ENCOUNTER — Other Ambulatory Visit: Payer: Self-pay

## 2018-11-05 ENCOUNTER — Emergency Department (HOSPITAL_COMMUNITY): Payer: Medicaid Other

## 2018-11-05 ENCOUNTER — Emergency Department (HOSPITAL_COMMUNITY)
Admission: EM | Admit: 2018-11-05 | Discharge: 2018-11-06 | Disposition: A | Discharge status: Home / Self Care | Payer: Medicaid Other | Attending: Emergency Medicine | Admitting: Emergency Medicine

## 2018-11-05 ENCOUNTER — Encounter (HOSPITAL_COMMUNITY): Payer: Self-pay | Admitting: Emergency Medicine

## 2018-11-05 DIAGNOSIS — S93401A Sprain of unspecified ligament of right ankle, initial encounter: Secondary | ICD-10-CM | POA: Insufficient documentation

## 2018-11-05 DIAGNOSIS — Y92009 Unspecified place in unspecified non-institutional (private) residence as the place of occurrence of the external cause: Secondary | ICD-10-CM | POA: Diagnosis not present

## 2018-11-05 DIAGNOSIS — X500XXA Overexertion from strenuous movement or load, initial encounter: Secondary | ICD-10-CM | POA: Insufficient documentation

## 2018-11-05 DIAGNOSIS — Y939 Activity, unspecified: Secondary | ICD-10-CM | POA: Diagnosis not present

## 2018-11-05 DIAGNOSIS — S99911A Unspecified injury of right ankle, initial encounter: Secondary | ICD-10-CM | POA: Diagnosis present

## 2018-11-05 DIAGNOSIS — Y999 Unspecified external cause status: Secondary | ICD-10-CM | POA: Insufficient documentation

## 2018-11-05 MED ORDER — IBUPROFEN 600 MG PO TABS: 600.0000 mg | ORAL_TABLET | Freq: Four times a day (QID) | ORAL | 0 refills | Status: DC

## 2018-11-05 MED ORDER — IBUPROFEN 800 MG PO TABS
800.0000 mg | ORAL_TABLET | Freq: Once | ORAL | Status: AC
  Administered 2018-11-05: 800 mg via ORAL
  Filled 2018-11-05: qty 1

## 2018-11-05 MED ORDER — ONDANSETRON HCL 4 MG PO TABS
4.0000 mg | ORAL_TABLET | Freq: Once | ORAL | Status: AC
  Administered 2018-11-05: 4 mg via ORAL
  Filled 2018-11-05: qty 1

## 2018-11-05 MED ORDER — TRAMADOL HCL 50 MG PO TABS
100.0000 mg | ORAL_TABLET | Freq: Once | ORAL | Status: AC
  Administered 2018-11-05: 100 mg via ORAL
  Filled 2018-11-05: qty 2

## 2018-11-05 NOTE — ED Triage Notes (Signed)
Pt states that she twisted her right ankle on Tuesday last week. Pt ambulatory to triage. No deformity noted.

## 2018-11-05 NOTE — ED Provider Notes (Signed)
[facility] EMERGENCY DEPARTMENT Provider Note   CSN: [id number] Arrival date & time: 11/05/18  2215     History   Chief Complaint Chief Complaint  Patient presents with  . Ankle Pain    HPI Chelsea Price is a 27 y.o. female.  No previous operations  Or procedures on the right ankle  The history is provided by the patient.  Ankle Pain   The incident occurred more than 1 week ago. The incident occurred at home. Injury mechanism: fall/twisting injury. The pain is present in the right ankle. The pain is moderate. The pain has been intermittent since onset. Pertinent negatives include no numbness, no loss of motion and no loss of sensation. Associated symptoms comments: Pain with bearing weight.. She has tried ice and heat for the symptoms. The treatment provided no relief.    History reviewed. No pertinent past medical history.  Patient Active Problem List   Diagnosis Date Noted  . Encounter for IUD insertion 12/02/2014  . Shoulder dystocia 10/25/2014  . Rh negative state in antepartum period 08/17/2014    Past Surgical History:  Procedure Laterality Date  . WISDOM TOOTH EXTRACTION    . WISDOM TOOTH EXTRACTION       OB History    Gravida  2   Para  2   Term  2   Preterm      AB      Living  2     SAB      TAB      Ectopic      Multiple  0   Live Births  2            Home Medications    Prior to Admission medications   Medication Sig Start Date End Date Taking? Authorizing Provider  acetaminophen (TYLENOL) 325 MG tablet Take 1,625 mg by mouth every 6 (six) hours as needed for mild pain.    Yes [provider]  levonorgestrel (MIRENA) 20 MCG/24HR IUD 1 each by Intrauterine route once.   Yes [provider]    Family History Family History  Problem Relation Age of Onset  . COPD Mother   . Hypertension Mother   . COPD Maternal Grandmother   . Hypertension Maternal Grandmother     Social History Social History    Tobacco Use  . Smoking status: Never Smoker  . Smokeless tobacco: Never Used  Substance Use Topics  . Alcohol use: No  . Drug use: No     Allergies   Latex   Review of Systems Review of Systems  Constitutional: Negative for activity change.       All ROS Neg except as noted in HPI  HENT: Negative for nosebleeds.   Eyes: Negative for photophobia and discharge.  Respiratory: Negative for cough, shortness of breath and wheezing.   Cardiovascular: Negative for chest pain and palpitations.  Gastrointestinal: Negative for abdominal pain and blood in stool.  Genitourinary: Negative for dysuria, frequency and hematuria.  Musculoskeletal: Negative for arthralgias, back pain and neck pain.       Ankle pain  Skin: Negative.   Neurological: Negative for dizziness, seizures, speech difficulty and numbness.  Psychiatric/Behavioral: Negative for confusion and hallucinations.     Physical Exam Updated Vital Signs BP (!) 156/89 (BP Location: Right Arm)   Pulse 100   Temp 98.1 F (36.7 C) (Temporal)   Ht 5' (1.524 m)   Wt 79.4 kg   SpO2 100%   BMI 34.18 kg/m  Physical Exam Vitals signs and nursing note reviewed.  Constitutional:      Appearance: She is well-developed. She is not toxic-appearing.  HENT:     Head: Normocephalic.     Right Ear: Tympanic membrane and external ear normal.     Left Ear: Tympanic membrane and external ear normal.  Eyes:     General: Lids are normal.     Pupils: Pupils are equal, round, and reactive to light.  Neck:     Musculoskeletal: Normal range of motion and neck supple.     Vascular: No carotid bruit.  Cardiovascular:     Rate and Rhythm: Normal rate and regular rhythm.     Pulses: Normal pulses.     Heart sounds: Normal heart sounds.  Pulmonary:     Effort: No respiratory distress.     Breath sounds: Normal breath sounds.  Abdominal:     General: Bowel sounds are normal.     Palpations: Abdomen is soft.     Tenderness: There is no  abdominal tenderness. There is no guarding.  Musculoskeletal: Normal range of motion.     Right ankle: She exhibits no swelling and no deformity. Tenderness. Lateral malleolus tenderness found. Achilles tendon normal.       Feet:  Lymphadenopathy:     Head:     Right side of head: No submandibular adenopathy.     Left side of head: No submandibular adenopathy.     Cervical: No cervical adenopathy.  Skin:    General: Skin is warm and dry.  Neurological:     Mental Status: She is alert and oriented to person, place, and time.     Cranial Nerves: No cranial nerve deficit.     Sensory: No sensory deficit.  Psychiatric:        Speech: Speech normal.      ED Treatments / Results  Labs (all labs ordered are listed, but only abnormal results are displayed) Labs Reviewed - No data to display  EKG None  Radiology Dg Ankle Complete Right  Result Date: 11/05/2018 CLINICAL DATA:  Right ankle twisting injury. EXAM: RIGHT ANKLE - COMPLETE 3+ VIEW COMPARISON:  None. FINDINGS: No acute fracture or dislocation. The ankle mortise is symmetric. The talar dome is intact. No tibiotalar joint effusion. Joint spaces are preserved. Bone mineralization is normal. Soft tissues are unremarkable. IMPRESSION: Negative. Electronically Signed   By: Obie DredgeWilliam T Derry M.D.   On: 11/05/2018 22:38    Procedures Procedures (including critical care time)  Medications Ordered in ED Medications - No data to display   Initial Impression / Assessment and Plan / ED Course  I have reviewed the triage vital signs and the nursing notes.  Pertinent labs & imaging results that were available during my care of the patient were reviewed by me and considered in my medical decision making (see chart for details).       Final Clinical Impressions(s) / ED Diagnoses MDM  Blood pressure slightly elevated.  Vital signs otherwise within normal limits.  Pulse oximetry is 100% on room air.  Within normal  limits.  Patient injured her right ankle nearly a week ago.  This problem continues to have increased in pain.  Patient presents now for assistance with this issue.  X-ray of the right ankle reviewed by me shows no acute fracture and no dislocation.  Mortise seems to be intact.  No neurovascular deficits appreciated.  The examination favors a sprain of the right ankle.  The patient is  placed in an ankle stirrup splint.  Crutches were offered, patient states that she would decline because she has crutches at home.  The patient is to use Tylenol and 600 mg of ibuprofen every 6 hours as needed for pain or discomfort.  Patient given 12 tablets of Ultram to use for more severe pain.  Patient is to follow-up with Dr. Romeo Apple for orthopedic evaluation if not improving.   Final diagnoses:  Sprain of right ankle, unspecified ligament, initial encounter    ED Discharge Orders    None       Ivery Quale, PA-C 11/06/18 0007    Bethann Berkshire, MD 11/06/18 1525

## 2018-11-06 MED ORDER — TRAMADOL HCL 50 MG PO TABS: 50.0000 mg | ORAL_TABLET | Freq: Four times a day (QID) | ORAL | 0 refills | Status: DC | PRN

## 2018-11-06 MED ORDER — IBUPROFEN 600 MG PO TABS: 600.0000 mg | ORAL_TABLET | Freq: Four times a day (QID) | ORAL | 0 refills | Status: AC

## 2018-11-06 NOTE — Discharge Instructions (Addendum)
Examination of your right lower extremity is negative for any deficits of your neurologic or vascular system.  The x-ray is negative for fracture or dislocation.  Please use the ankle splint when up and about over the next 5 to 7 days.  Please wear a supporting shoe.  You do not need to sleep in this device.  Please see Dr. Romeo AppleHarrison for orthopedic evaluation if not improving.  Please use 600 mg of ibuprofen and 1000 mg of Tylenol with breakfast, lunch, dinner, and at bedtime. Use ultram for more severe pain. This medication may cause drowsiness. Please do not drink, drive, or participate in activity that requires concentration while taking this medication.

## 2019-08-12 ENCOUNTER — Encounter: Payer: Medicaid Other | Admitting: Neurology

## 2019-08-12 ENCOUNTER — Telehealth: Payer: Self-pay | Admitting: Neurology

## 2019-08-12 ENCOUNTER — Encounter: Payer: Self-pay | Admitting: Neurology

## 2019-08-12 NOTE — Telephone Encounter (Signed)
This patient did not show for an EMG and nerve conduction study appointment today. 

## 2019-11-28 ENCOUNTER — Ambulatory Visit: Payer: Medicaid Other | Attending: Internal Medicine

## 2019-11-28 DIAGNOSIS — Z20822 Contact with and (suspected) exposure to covid-19: Secondary | ICD-10-CM

## 2019-11-29 LAB — NOVEL CORONAVIRUS, NAA: SARS-CoV-2, NAA: NOT DETECTED

## 2020-05-06 ENCOUNTER — Encounter: Payer: Self-pay | Admitting: Neurology

## 2020-08-16 ENCOUNTER — Other Ambulatory Visit: Payer: Self-pay

## 2020-08-16 ENCOUNTER — Ambulatory Visit (INDEPENDENT_AMBULATORY_CARE_PROVIDER_SITE_OTHER): Payer: Medicaid Other | Admitting: Neurology

## 2020-08-16 ENCOUNTER — Encounter: Payer: Self-pay | Admitting: Neurology

## 2020-08-16 VITALS — BP 142/85 | HR 98 | Ht 62.0 in | Wt 167.0 lb

## 2020-08-16 DIAGNOSIS — R200 Anesthesia of skin: Secondary | ICD-10-CM | POA: Diagnosis not present

## 2020-08-16 DIAGNOSIS — R202 Paresthesia of skin: Secondary | ICD-10-CM

## 2020-08-16 NOTE — Patient Instructions (Signed)
Start wearing a wrist brace at night time  Nerve testing of both arms  ELECTROMYOGRAM AND NERVE CONDUCTION STUDIES (EMG/NCS) INSTRUCTIONS  How to Prepare The neurologist conducting the EMG will need to know if you have certain medical conditions. Tell the neurologist and other EMG lab personnel if you: . Have a pacemaker or any other electrical medical device . Take blood-thinning medications . Have hemophilia, a blood-clotting disorder that causes prolonged bleeding Bathing Take a shower or bath shortly before your exam in order to remove oils from your skin. Don't apply lotions or creams before the exam.  What to Expect You'll likely be asked to change into a hospital gown for the procedure and lie down on an examination table. The following explanations can help you understand what will happen during the exam.  . Electrodes. The neurologist or a technician places surface electrodes at various locations on your skin depending on where you're experiencing symptoms. Or the neurologist may insert needle electrodes at different sites depending on your symptoms.  . Sensations. The electrodes will at times transmit a tiny electrical current that you may feel as a twinge or spasm. The needle electrode may cause discomfort or pain that usually ends shortly after the needle is removed. If you are concerned about discomfort or pain, you may want to talk to the neurologist about taking a short break during the exam.  . Instructions. During the needle EMG, the neurologist will assess whether there is any spontaneous electrical activity when the muscle is at rest - activity that isn't present in healthy muscle tissue - and the degree of activity when you slightly contract the muscle.  He or she will give you instructions on resting and contracting a muscle at appropriate times. Depending on what muscles and nerves the neurologist is examining, he or she may ask you to change positions during the exam.   After your EMG You may experience some temporary, minor bruising where the needle electrode was inserted into your muscle. This bruising should fade within several days. If it persists, contact your primary care doctor.

## 2020-08-16 NOTE — Progress Notes (Signed)
[facility] Neurology Division Clinic Note - Initial Visit   Date: 08/16/20  Chelsea Price MRN: [id number] DOB: [birth year]   Dear Chelsea Sacramento, NP:  Thank you for your kind referral of Chelsea Price for consultation of bilateral arm numbness. Although her history is well known to you, please allow [place] to reiterate it for the purpose of our medical record. The patient was accompanied to the clinic by self.   History of Present Illness: Chelsea Price is a 29 y.o. right-handed female presenting for evaluation of bilateral arm numbness.   Starting in childhood, she began having numbness involving the the fingers that extends into her forearm and arm up to the neck.  Symptoms previously occurred once every few months, however, over the past year, her symptoms have become more frequently, occurring daily.  It may last from several hours to the full day and usually worse at night time.  She has generalized sensation of tiredness in the arms and weakness in the wrists.  She has not tried wrist splints.  She takes gabapentin 200mg  1-2 times per day.  She works at and says that lifting makes it worse.   History reviewed. No pertinent past medical history.  Past Surgical History:  Procedure Laterality Date   WISDOM TOOTH EXTRACTION     WISDOM TOOTH EXTRACTION       Medications:  Outpatient Encounter Medications as of 08/16/2020  Medication Sig   acetaminophen (TYLENOL) 325 MG tablet Take 1,625 mg by mouth every 6 (six) hours as needed for mild pain.    ibuprofen (ADVIL,MOTRIN) 600 MG tablet Take 1 tablet (600 mg total) by mouth 4 (four) times daily.   levonorgestrel (MIRENA) 20 MCG/24HR IUD 1 each by Intrauterine route once.   traMADol (ULTRAM) 50 MG tablet Take 1 tablet (50 mg total) by mouth every 6 (six) hours as needed.   No facility-administered encounter medications on file as of 08/16/2020.    Allergies:  Allergies  Allergen Reactions   Latex Hives     Family History: Family History  Problem Relation Age of Onset   COPD Mother    Hypertension Mother    COPD Maternal Grandmother    Hypertension Maternal Grandmother     Social History: Social History   Tobacco Use   Smoking status: Never Smoker   Smokeless tobacco: Never Used  08/18/2020 Use: Never used  Substance Use Topics   Alcohol use: No   Drug use: No   Social History   Social History Narrative   Right Handed   Lives in a one story home   Doesn't really drink caffeine    Vital Signs:  BP (!) 142/85    Pulse 98    Ht 5\' 2"  (1.575 m)    Wt 167 lb (75.8 kg)    SpO2 99%    BMI 30.54 kg/m   Neurological Exam: MENTAL STATUS including orientation to time, place, person, recent and remote memory, attention span and concentration, language, and fund of knowledge is normal.  Speech is not dysarthric.  CRANIAL NERVES: II:  No visual field defects.     III-IV-VI: Pupils equal round and reactive.  Normal conjugate, extra-ocular eye movements in all directions of gaze.  No nystagmus.  No ptosis.   V:  Normal facial sensation.    VII:  Normal facial symmetry and movements.   VIII:  Normal hearing and vestibular function.   IX-X:  Normal palatal movement.   XI:  Normal shoulder shrug and head rotation.   XII:  Normal tongue strength and range of motion, no deviation or fasciculation.  MOTOR:  No atrophy, fasciculations or abnormal movements.  No pronator drift.   Upper Extremity:  Right  Left  Deltoid  5/5   5/5   Biceps  5/5   5/5   Triceps  5/5   5/5   Infraspinatus 5/5  5/5  Medial pectoralis 5/5  5/5  Wrist extensors  5/5   5/5   Wrist flexors  5/5   5/5   Finger extensors  5/5   5/5   Finger flexors  5/5   5/5   Dorsal interossei  5/5   5/5   Abductor pollicis  5/5   5/5   Tone (Ashworth scale)  0  0   Lower Extremity:  Right  Left  Hip flexors  5/5   5/5   Hip extensors  5/5   5/5   Adductor 5/5  5/5  Abductor 5/5  5/5  Knee  flexors  5/5   5/5   Knee extensors  5/5   5/5   Dorsiflexors  5/5   5/5   Plantarflexors  5/5   5/5   Toe extensors  5/5   5/5   Toe flexors  5/5   5/5   Tone (Ashworth scale)  0  0   MSRs:  Right        Left                  brachioradialis 2+  2+  biceps 2+  2+  triceps 2+  2+  patellar 2+  2+  ankle jerk 2+  2+  Hoffman no  no  plantar response down  down   SENSORY:  Normal and symmetric perception of light touch, pinprick, vibration, and proprioception.  Tinel's sign is negative bilaterally at the wrists  COORDINATION/GAIT: Normal finger-to- nose-finger.  Intact rapid alternating movements bilaterally.  Able to rise from a chair without using arms.  Gait narrow based and stable. Tandem and stressed gait intact.    IMPRESSION: Bilateral arm paresthesias, worse in the right hand.  Symptoms may be entrapment neuropathy such as carpal tunnel syndrome, although this would not explain proximal paresthesias.  History is not suggestive of cervical radiculopathy.  NCS/EMG of the arms will be ordered to localize symptoms. In the meantime, recommend that she try wearing a wrist splint at nighttime.   Further recommendations pending results.  Thank you for allowing me to participate in patient's care.  If I can answer any additional questions, I would be pleased to do so.    Sincerely,    Salvadore Valvano K. Allena Katz, DO

## 2020-09-21 ENCOUNTER — Other Ambulatory Visit: Payer: Self-pay

## 2020-09-21 ENCOUNTER — Telehealth: Payer: Self-pay

## 2020-09-21 ENCOUNTER — Ambulatory Visit (INDEPENDENT_AMBULATORY_CARE_PROVIDER_SITE_OTHER): Payer: Medicaid Other | Admitting: Neurology

## 2020-09-21 DIAGNOSIS — R202 Paresthesia of skin: Secondary | ICD-10-CM | POA: Diagnosis not present

## 2020-09-21 DIAGNOSIS — G5603 Carpal tunnel syndrome, bilateral upper limbs: Secondary | ICD-10-CM

## 2020-09-21 DIAGNOSIS — R200 Anesthesia of skin: Secondary | ICD-10-CM | POA: Diagnosis not present

## 2020-09-21 NOTE — Telephone Encounter (Signed)
-----   Message from Glendale Chard, DO sent at 09/21/2020 11:55 AM EDT ----- Please send referral to the [facility] [place] with Drs. Weingold/Kuzma for bilateral CTS.  Please fax today's note and EMG results.  Thanks.

## 2020-09-21 NOTE — Telephone Encounter (Signed)
Referral created and sent to [facility].

## 2020-09-21 NOTE — Progress Notes (Signed)
    Follow-up Visit   Date: 09/21/20   Chelsea Price MRN: [id number] DOB: [birth year]   Interim History: Chelsea Price is a 29 y.o. right-handed Caucasian female returning to the clinic for follow-up of bilateral hand tingling.  The patient was accompanied to the clinic by friend.    History of present illness: Starting in childhood, she began having numbness involving the the fingers that extends into her forearm and arm up to the neck.  Symptoms previously occurred once every few months, however, over the past year, her symptoms have become more frequently, occurring daily.  It may last from several hours to the full day and usually worse at night time.  She has generalized sensation of tiredness in the arms and weakness in the wrists.  She has not tried wrist splints.  She takes gabapentin 200mg  1-2 times per day.  UPDATE 09/21/2020:  She is here for EDX of the hands and to discuss results (see below).  She has been wearing wrist splints at night time, which seem to provide some relief, but is unable to wear it during the day because of the type of work she does.  Her numbness is becoming more constant which is concerning her.  She has some weakness in the hands.  Medications:  Current Outpatient Medications on File Prior to Visit  Medication Sig Dispense Refill  . acetaminophen (TYLENOL) 325 MG tablet Take 1,625 mg by mouth every 6 (six) hours as needed for mild pain.     13/12/2019 ibuprofen (ADVIL,MOTRIN) 600 MG tablet Take 1 tablet (600 mg total) by mouth 4 (four) times daily. 30 tablet 0  . levonorgestrel (MIRENA) 20 MCG/24HR IUD 1 each by Intrauterine route once.     No current facility-administered medications on file prior to visit.    Allergies:  Allergies  Allergen Reactions  . Latex Hives    Vital Signs:  There were no vitals taken for this visit.  Neurological Exam: MENTAL STATUS including orientation to time, place, person, recent and remote memory, attention span  and concentration, language, and fund of knowledge is normal.  Speech is not dysarthric.  CRANIAL NERVES:  Normal conjugate, extra-ocular eye movements in all directions of gaze.  No ptosis   MOTOR:  Motor strength is 5/5 in all extremities, including bilateral APB.  No atrophy, fasciculations or abnormal movements.  No pronator drift.  Tone is normal.     COORDINATION/GAIT: Gait narrow based and stable.   Data: NCS/EMG of the arms 09/21/2020: 1. Bilateral median neuropathy at or distal to the wrist, consistent with a clinical diagnosis of carpal tunnel syndrome.  Overall, these findings are mild in degree electrically, and worse on the right. 2. Incidentally, there is evidence of a bilateral Martin-Gruber anastomoses, a normal anatomic variant.   IMPRESSION/PLAN: Bilateral carpal tunnel syndrome, worse on the right (mild on NCS).  Patient has minimal relief with wearing wrist splints, so would like to see Hand Specialist for further management options. Referral placed.   Thank you for allowing me to participate in patient's care.  If I can answer any additional questions, I would be pleased to do so.    Sincerely,    Marquia Costello K. 13/12/2019, DO

## 2020-09-21 NOTE — Procedures (Signed)
[facility] Neurology  [address] [place], [address]  [place], [place] [address] Tel: [phone] Fax:  [phone] Test Date:  09/21/2020  Patient: Chelsea Price DOB: 1991/01/12 Physician: Nita Sickle, DO  Sex: Female Height: 5\' 2"  Ref Phys: , DO  ID#: Nita Sickle   Technician:    Patient Complaints: This is a 29 year old female referred for evaluation of bilateral hand numbness and tingling.  NCV & EMG Findings: Extensive electrodiagnostic testing of the right upper extremity and additional studies of the left shows:  1. Bilateral median sensory responses show prolonged latency (R4.4, L3.4 ms).  Bilateral ulnar sensory responses are within normal limits.   2. Bilateral ulnar and median motor responses are within normal limits.  Of note, there is evidence of bilateral Martin-Gruber anastomosis as seen by a greater proximal median amplitude and a motor response at the ulnar-wrist recording at the abductor pollicis brevis muscle. 3. There is no evidence of active or chronic motor axonal loss changes affecting any of the tested muscles.  Motor unit configuration and recruitment pattern is within normal limits.  Impression: 1. Bilateral median neuropathy at or distal to the wrist, consistent with a clinical diagnosis of carpal tunnel syndrome.  Overall, these findings are mild in degree electrically, and worse on the right. 2. Incidentally, there is evidence of a bilateral Martin-Gruber anastomoses, a normal anatomic variant.   ___________________________ 34, DO    Nerve Conduction Studies Anti Sensory Summary Table   Stim Site NR Peak (ms) Norm Peak (ms) P-T Amp (V) Norm P-T Amp  Left Median Anti Sensory (2nd Digit)  34C  Wrist    3.4 <3.3 56.8 >20  Right Median Anti Sensory (2nd Digit)  34C  Wrist    4.4 <3.3 30.0 >20  Left Ulnar Anti Sensory (5th Digit)  34C  Wrist    2.3 <3.0 51.2 >18  Right Ulnar Anti Sensory (5th Digit)  34C  Wrist    2.4 <3.0  54.8 >18   Motor Summary Table   Stim Site NR Onset (ms) Norm Onset (ms) O-P Amp (mV) Norm O-P Amp Site1 Site2 Delta-0 (ms) Dist (cm) Vel (m/s) Norm Vel (m/s)  Left Median Motor (Abd Poll Brev)  34C  Wrist    3.2 <3.9 7.7 >6 Elbow Wrist 4.9 27.0 55 >51  Elbow    8.1  9.5  Ulnar-wrist crossover Elbow 4.2 0.0    Ulnar-wrist crossover    3.9  8.2         Right Median Motor (Abd Poll Brev)  34C  Wrist    3.8 <3.9 8.3 >6 Elbow Wrist 4.7 26.0 55 >51  Elbow    8.5  9.9  Ulnar-wrist crossover Elbow 4.7 0.0    Ulnar-wrist crossover    3.8  6.4         Left Ulnar Motor (Abd Dig Minimi)  34C  Wrist    2.2 <3.0 10.5 >8 B Elbow Wrist 3.3 21.0 64 >51  B Elbow    5.5  9.1  A Elbow B Elbow 1.6 10.0 63 >51  A Elbow    7.1  8.8         Right Ulnar Motor (Abd Dig Minimi)  34C  Wrist    2.3 <3.0 12.5 >8 B Elbow Wrist 3.5 22.5 64 >51  B Elbow    5.8  11.6  A Elbow B Elbow 1.6 10.0 62 >51  A Elbow    7.4  11.4  EMG   Side Muscle Ins Act Fibs Psw Fasc Number Recrt Dur Dur. Amp Amp. Poly Poly. Comment  Right 1stDorInt Nml Nml Nml Nml Nml Nml Nml Nml Nml Nml Nml Nml N/A  Right Abd Poll Brev Nml Nml Nml Nml Nml Nml Nml Nml Nml Nml Nml Nml N/A  Right PronatorTeres Nml Nml Nml Nml Nml Nml Nml Nml Nml Nml Nml Nml N/A  Right Biceps Nml Nml Nml Nml Nml Nml Nml Nml Nml Nml Nml Nml N/A  Right Triceps Nml Nml Nml Nml Nml Nml Nml Nml Nml Nml Nml Nml N/A  Right Deltoid Nml Nml Nml Nml Nml Nml Nml Nml Nml Nml Nml Nml N/A  Left 1stDorInt Nml Nml Nml Nml Nml Nml Nml Nml Nml Nml Nml Nml N/A  Left Abd Poll Brev Nml Nml Nml Nml Nml Nml Nml Nml Nml Nml Nml Nml N/A  Left PronatorTeres Nml Nml Nml Nml Nml Nml Nml Nml Nml Nml Nml Nml N/A  Left Biceps Nml Nml Nml Nml Nml Nml Nml Nml Nml Nml Nml Nml N/A  Left Triceps Nml Nml Nml Nml Nml Nml Nml Nml Nml Nml Nml Nml N/A  Left Deltoid Nml Nml Nml Nml Nml Nml Nml Nml Nml Nml Nml Nml N/A      Waveforms:

## 2020-09-21 NOTE — Addendum Note (Signed)
Addended by: Karl Luke A on: 09/21/2020 12:53 PM   Modules accepted: Orders

## 2020-12-20 ENCOUNTER — Ambulatory Visit: Payer: Medicaid Other | Admitting: Adult Health

## 2021-01-03 ENCOUNTER — Ambulatory Visit: Payer: Medicaid Other | Admitting: Adult Health

## 2021-01-17 ENCOUNTER — Ambulatory Visit: Payer: Medicaid Other | Admitting: Adult Health

## 2021-01-17 ENCOUNTER — Other Ambulatory Visit: Payer: Self-pay

## 2021-01-17 ENCOUNTER — Encounter: Payer: Self-pay | Admitting: Adult Health

## 2021-01-17 VITALS — BP 127/91 | HR 76 | Ht 62.0 in | Wt 175.0 lb

## 2021-01-17 DIAGNOSIS — Z30433 Encounter for removal and reinsertion of intrauterine contraceptive device: Secondary | ICD-10-CM

## 2021-01-17 DIAGNOSIS — Z3202 Encounter for pregnancy test, result negative: Secondary | ICD-10-CM | POA: Diagnosis not present

## 2021-01-17 LAB — POCT URINE PREGNANCY: Preg Test, Ur: NEGATIVE

## 2021-01-17 MED ORDER — LEVONORGESTREL 20 MCG/24HR IU IUD: INTRAUTERINE_SYSTEM | Freq: Once | INTRAUTERINE | Status: AC

## 2021-01-17 NOTE — Progress Notes (Signed)
Patient ID: [name], female   DOB: 08/25/1991, 30 y.o.   MRN: [id number]   IUD INSERTION Patient name: Chelsea Price MRN [id number]  Date of birth: 1991/02/12 Subjective Findings:   Chelsea Price is a 30 y.o. G2P2002 white female, single, G2P2, being seen today for removal of liletta IUD and  insertion of a mirena IUD. She uses for period management, has female partner.  Depression screen [facility] 2/9 01/17/2021  Decreased Interest 3  Down, Depressed, Hopeless 3  PHQ - 2 Score 6  Altered sleeping 3  Tired, decreased energy 3  Change in appetite 3  Feeling bad or failure about yourself  2  Trouble concentrating 2  Moving slowly or fidgety/restless 2  Suicidal thoughts 1  PHQ-9 Score 22   GAD 7 score is 19 -will follow with PCP, has stopped meds but denies any SI or HI  Fall risk is low AA is 0.  Patient's last menstrual period was 01/11/2021. Last sexual intercourse was has female partner for 3 years Last pap:2015  The risks and benefits of the method and placement have been thouroughly reviewed with the patient and all questions were answered.  Specifically the patient is aware of failure rate of 11/998, expulsion of the IUD and of possible perforation.  The patient is aware of irregular bleeding due to the method and understands the incidence of irregular bleeding diminishes with time.  Signed copy of informed consent in chart.  Pertinent History Reviewed:   Reviewed past medical,surgical, social, obstetrical and family history.  Reviewed problem list, medications and allergies. Objective Findings & Procedure:   Vitals:   01/17/21 0905  BP: (!) 127/91  Pulse: 76  Weight: 175 lb (79.4 kg)  Height: 5\' 2"  (1.575 m)  Body mass index is 32.01 kg/m.  Results for orders placed or performed in visit on 01/17/21 (from the past 24 hour(s))  POCT urine pregnancy   Collection Time: 01/17/21  9:22 AM  Result Value Ref Range   Preg Test, Ur Negative Negative     Time out was  performed.  A medium pederson speculum was placed in the vagina. IUD string seen and pt asked to cough and Liletta IUD easily removed. The cervix was visualized, prepped using Betadine, and grasped with a single tooth tenaculum. The uterus was found to be slightly posterior and  it sounded to 7-8 cm.   IUD placed per manufacturer's recommendations. The strings were trimmed to approximately 3 cm. The patient tolerated the procedure well.   POC sonogram was performed and the proper placement of the IUD was verified by 01/19/21.   Chaperone: [facility] LPN Assessment & Plan:   1) Mirena IUD insertion The patient was given post procedure instructions, including signs and symptoms of infection and to check for the strings after each menses or each month, and refraining from intercourse or anything in the vagina for 3 days. She was given a care card with date IUD placed, and date IUD to be removed. She is scheduled for a f/u appointment in 4 weeks.  Orders Placed This Encounter  Procedures  . POCT urine pregnancy    Return in 4 weeks for pap and physical and IUD check   Malachy Mood ANP-BC,GNP 01/17/2021 9:45 AM

## 2021-01-17 NOTE — Addendum Note (Signed)
Addended by: Colen Darling on: 01/17/2021 10:12 AM   Modules accepted: Orders

## 2021-02-16 ENCOUNTER — Encounter: Payer: Self-pay | Admitting: Adult Health

## 2021-02-16 ENCOUNTER — Ambulatory Visit: Payer: Medicaid Other | Admitting: Adult Health

## 2021-02-16 ENCOUNTER — Other Ambulatory Visit: Payer: Self-pay

## 2021-02-16 ENCOUNTER — Other Ambulatory Visit (HOSPITAL_COMMUNITY)
Admission: RE | Admit: 2021-02-16 | Discharge: 2021-02-16 | Disposition: A | Discharge status: Home / Self Care | Payer: Medicaid Other | Source: Ambulatory Visit | Attending: Adult Health | Admitting: Adult Health

## 2021-02-16 VITALS — BP 119/81 | HR 89 | Ht 62.25 in | Wt 171.0 lb

## 2021-02-16 DIAGNOSIS — Z0000 Encounter for general adult medical examination without abnormal findings: Secondary | ICD-10-CM | POA: Insufficient documentation

## 2021-02-16 DIAGNOSIS — Z01419 Encounter for gynecological examination (general) (routine) without abnormal findings: Secondary | ICD-10-CM | POA: Diagnosis not present

## 2021-02-16 DIAGNOSIS — Z30431 Encounter for routine checking of intrauterine contraceptive device: Secondary | ICD-10-CM

## 2021-02-16 DIAGNOSIS — Z975 Presence of (intrauterine) contraceptive device: Secondary | ICD-10-CM

## 2021-02-16 NOTE — Progress Notes (Signed)
Patient ID: Chelsea Price, female   DOB: 10/01/1991, 30 y.o.   MRN: [id number] History of Present Illness: Meghen is a 30 year old white female,single, G2P2 in for well woman gyn exam and pap and IUD check, mirena was inserted 01/17/21. PCP is Carmel [place] NP.    Current Medications, Allergies, Past Medical History, Past Surgical History, Family History and Social History were reviewed in [employer] record.     Review of Systems: Patient denies any headaches, hearing loss, fatigue, blurred vision, shortness of breath, chest pain, abdominal pain, problems with bowel movements, urination, or intercourse. No joint pain or mood swings. Has had some spotting with IUD    Physical Exam:BP 119/81 (BP Location: Left Arm, Patient Position: Sitting, Cuff Size: Normal)   Pulse 89   Ht 5' 2.25" (1.581 m)   Wt 171 lb (77.6 kg)   BMI 31.03 kg/m  General:  Well developed, well nourished, no acute distress Skin:  Warm and dry Neck:  Midline trachea, normal thyroid, good ROM, no lymphadenopathy Lungs; Clear to auscultation bilaterally Breast:  No dominant palpable mass, retraction, or nipple discharge Cardiovascular: Regular rate and rhythm Abdomen:  Soft, non tender, no hepatosplenomegaly Pelvic:  External genitalia is normal in appearance, no lesions.  The vagina is normal in appearance. Urethra has no lesions or masses. The cervix is bulbous,+IUD strings at os and lite blood, pap with HR HPV genotyping performed.  Uterus is felt to be normal size, shape, and contour.  No adnexal masses or tenderness noted.Bladder is non tender, no masses felt. POC US performed at bedside and IUD in place. Extremities/musculoskeletal:  No swelling or varicosities noted, no clubbing or cyanosis Psych:  No mood changes, alert and cooperative,seems happy AA is 0 Fall risk is low PHQ 9 score is 19, declines meds sees therapist  GAD 7 score is 13  Upstream - 02/16/21 1134      Pregnancy  Intention Screening   Does the patient want to become pregnant in the next year? No    Does the patient's partner want to become pregnant in the next year? No    Would the patient like to discuss contraceptive options today? No      Contraception Wrap Up   Current Method IUD or IUS    End Method IUD or IUS    Contraception Counseling Provided No         Examination chaperoned by Malachy Mood LPN  Impression and Plan: 1. Routine medical exam Follow up with PCP for labs and therapy   2. Encounter for gynecological examination with Papanicolaou smear of cervix Pap sent Physical in 1 year Pap in 3 if normal  Mammogram at 42   3. IUD check up  4. IUD (intrauterine device) in place

## 2021-02-17 LAB — CYTOLOGY - PAP
Comment: NEGATIVE
Diagnosis: NEGATIVE
High risk HPV: NEGATIVE

## 2021-07-06 ENCOUNTER — Inpatient Hospital Stay (HOSPITAL_COMMUNITY): Payer: Medicaid Other

## 2021-07-06 ENCOUNTER — Inpatient Hospital Stay (HOSPITAL_BASED_OUTPATIENT_CLINIC_OR_DEPARTMENT_OTHER)
Admission: EM | Admit: 2021-07-06 | Discharge: 2021-07-07 | DRG: 964 | Disposition: A | Discharge status: Home / Self Care | Payer: Medicaid Other | Attending: General Surgery | Admitting: General Surgery

## 2021-07-06 ENCOUNTER — Other Ambulatory Visit: Payer: Self-pay

## 2021-07-06 ENCOUNTER — Emergency Department (HOSPITAL_BASED_OUTPATIENT_CLINIC_OR_DEPARTMENT_OTHER): Payer: Medicaid Other

## 2021-07-06 ENCOUNTER — Encounter (HOSPITAL_BASED_OUTPATIENT_CLINIC_OR_DEPARTMENT_OTHER): Payer: Self-pay | Admitting: *Deleted

## 2021-07-06 DIAGNOSIS — Z20822 Contact with and (suspected) exposure to covid-19: Secondary | ICD-10-CM | POA: Diagnosis present

## 2021-07-06 DIAGNOSIS — Y92410 Unspecified street and highway as the place of occurrence of the external cause: Secondary | ICD-10-CM | POA: Diagnosis not present

## 2021-07-06 DIAGNOSIS — Z91040 Latex allergy status: Secondary | ICD-10-CM

## 2021-07-06 DIAGNOSIS — S36113A Laceration of liver, unspecified degree, initial encounter: Principal | ICD-10-CM | POA: Diagnosis present

## 2021-07-06 DIAGNOSIS — D62 Acute posthemorrhagic anemia: Secondary | ICD-10-CM | POA: Diagnosis present

## 2021-07-06 DIAGNOSIS — S27892A Contusion of other specified intrathoracic organs, initial encounter: Secondary | ICD-10-CM | POA: Diagnosis present

## 2021-07-06 DIAGNOSIS — Z886 Allergy status to analgesic agent status: Secondary | ICD-10-CM | POA: Diagnosis not present

## 2021-07-06 DIAGNOSIS — R079 Chest pain, unspecified: Secondary | ICD-10-CM | POA: Diagnosis not present

## 2021-07-06 LAB — CBC WITH DIFFERENTIAL/PLATELET
Abs Immature Granulocytes: 0.02 10*3/uL (ref 0.00–0.07)
Basophils Absolute: 0 10*3/uL (ref 0.0–0.1)
Basophils Relative: 0 %
Eosinophils Absolute: 0.1 10*3/uL (ref 0.0–0.5)
Eosinophils Relative: 1 %
HCT: 41.7 % (ref 36.0–46.0)
Hemoglobin: 13.4 g/dL (ref 12.0–15.0)
Immature Granulocytes: 0 %
Lymphocytes Relative: 18 %
Lymphs Abs: 1.6 10*3/uL (ref 0.7–4.0)
MCH: 27.3 pg (ref 26.0–34.0)
MCHC: 32.1 g/dL (ref 30.0–36.0)
MCV: 85.1 fL (ref 80.0–100.0)
Monocytes Absolute: 0.5 10*3/uL (ref 0.1–1.0)
Monocytes Relative: 6 %
Neutro Abs: 6.7 10*3/uL (ref 1.7–7.7)
Neutrophils Relative %: 75 %
Platelets: 272 10*3/uL (ref 150–400)
RBC: 4.9 MIL/uL (ref 3.87–5.11)
RDW: 12.5 % (ref 11.5–15.5)
WBC: 9 10*3/uL (ref 4.0–10.5)
nRBC: 0 % (ref 0.0–0.2)

## 2021-07-06 LAB — COMPREHENSIVE METABOLIC PANEL
ALT: 68 U/L — ABNORMAL HIGH (ref 0–44)
AST: 93 U/L — ABNORMAL HIGH (ref 15–41)
Albumin: 4.5 g/dL (ref 3.5–5.0)
Alkaline Phosphatase: 87 U/L (ref 38–126)
Anion gap: 11 (ref 5–15)
BUN: 10 mg/dL (ref 6–20)
CO2: 25 mmol/L (ref 22–32)
Calcium: 9.7 mg/dL (ref 8.9–10.3)
Chloride: 105 mmol/L (ref 98–111)
Creatinine, Ser: 0.6 mg/dL (ref 0.44–1.00)
GFR, Estimated: >60 mL/min (ref >60)
Glucose, Bld: 103 mg/dL — ABNORMAL HIGH (ref 70–99)
Potassium: 3.1 mmol/L — ABNORMAL LOW (ref 3.5–5.1)
Sodium: 141 mmol/L (ref 135–145)
Total Bilirubin: 0.5 mg/dL (ref 0.3–1.2)
Total Protein: 7.3 g/dL (ref 6.5–8.1)

## 2021-07-06 LAB — CBC
HCT: 39.7 % (ref 36.0–46.0)
HCT: 38.7 % (ref 36.0–46.0)
Hemoglobin: 13.1 g/dL (ref 12.0–15.0)
Hemoglobin: 12.8 g/dL (ref 12.0–15.0)
MCH: 28.7 pg (ref 26.0–34.0)
MCH: 28.4 pg (ref 26.0–34.0)
MCHC: 33 g/dL (ref 30.0–36.0)
MCHC: 33.1 g/dL (ref 30.0–36.0)
MCV: 87.1 fL (ref 80.0–100.0)
MCV: 86 fL (ref 80.0–100.0)
Platelets: 238 10*3/uL (ref 150–400)
Platelets: 242 10*3/uL (ref 150–400)
RBC: 4.56 MIL/uL (ref 3.87–5.11)
RBC: 4.5 MIL/uL (ref 3.87–5.11)
RDW: 12.3 % (ref 11.5–15.5)
RDW: 12.3 % (ref 11.5–15.5)
WBC: 11.8 10*3/uL — ABNORMAL HIGH (ref 4.0–10.5)
WBC: 9.1 10*3/uL (ref 4.0–10.5)
nRBC: 0 % (ref 0.0–0.2)
nRBC: 0 % (ref 0.0–0.2)

## 2021-07-06 LAB — PREGNANCY, URINE: Preg Test, Ur: NEGATIVE

## 2021-07-06 LAB — ECHOCARDIOGRAM COMPLETE
Height: 62 in
S' Lateral: 2.8 cm
Weight: 2720 oz

## 2021-07-06 LAB — RESP PANEL BY RT-PCR (FLU A&B, COVID) ARPGX2
Influenza A by PCR: NEGATIVE
Influenza B by PCR: NEGATIVE
SARS Coronavirus 2 by RT PCR: NEGATIVE

## 2021-07-06 LAB — URINALYSIS, ROUTINE W REFLEX MICROSCOPIC
Glucose, UA: NEGATIVE mg/dL
Ketones, ur: 15 mg/dL — AB
Nitrite: NEGATIVE
Protein, ur: 100 mg/dL — AB
Specific Gravity, Urine: >1.03 — ABNORMAL HIGH (ref 1.005–1.030)
pH: 6 (ref 5.0–8.0)

## 2021-07-06 LAB — URINALYSIS, MICROSCOPIC (REFLEX)

## 2021-07-06 LAB — MRSA NEXT GEN BY PCR, NASAL: MRSA by PCR Next Gen: NOT DETECTED

## 2021-07-06 MED ORDER — ONDANSETRON HCL 4 MG/2ML IJ SOLN: 4.0000 mg | Freq: Four times a day (QID) | INTRAMUSCULAR | Status: DC | PRN

## 2021-07-06 MED ORDER — IOHEXOL 350 MG/ML SOLN
75.0000 mL | Freq: Once | INTRAVENOUS | Status: AC | PRN
  Administered 2021-07-06: 75 mL via INTRAVENOUS

## 2021-07-06 MED ORDER — FENTANYL CITRATE (PF) 100 MCG/2ML IJ SOLN
50.0000 ug | Freq: Once | INTRAMUSCULAR | Status: AC
  Administered 2021-07-06: 50 ug via INTRAVENOUS
  Filled 2021-07-06: qty 2

## 2021-07-06 MED ORDER — POTASSIUM CHLORIDE IN NACL 20-0.45 MEQ/L-% IV SOLN
INTRAVENOUS | Status: DC
  Filled 2021-07-06 (×2): qty 1000

## 2021-07-06 MED ORDER — ONDANSETRON 4 MG PO TBDP: 4.0000 mg | ORAL_TABLET | Freq: Four times a day (QID) | ORAL | Status: DC | PRN

## 2021-07-06 MED ORDER — METOPROLOL TARTRATE 5 MG/5ML IV SOLN: 5.0000 mg | Freq: Four times a day (QID) | INTRAVENOUS | Status: DC | PRN

## 2021-07-06 MED ORDER — MORPHINE SULFATE (PF) 2 MG/ML IV SOLN
2.0000 mg | INTRAVENOUS | Status: DC | PRN
  Administered 2021-07-06 – 2021-07-07 (×4): 2 mg via INTRAVENOUS
  Filled 2021-07-06 (×4): qty 1

## 2021-07-06 MED ORDER — POTASSIUM CHLORIDE 10 MEQ/100ML IV SOLN
10.0000 meq | INTRAVENOUS | Status: AC
  Administered 2021-07-06 (×4): 10 meq via INTRAVENOUS
  Filled 2021-07-06 (×4): qty 100

## 2021-07-06 MED ORDER — ACETAMINOPHEN 10 MG/ML IV SOLN
1000.0000 mg | Freq: Four times a day (QID) | INTRAVENOUS | Status: DC
  Administered 2021-07-06 – 2021-07-07 (×3): 1000 mg via INTRAVENOUS
  Filled 2021-07-06 (×3): qty 100
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
## 2021-07-06 NOTE — ED Notes (Signed)
Cont to await for the full CT scan results for ED evaluation.

## 2021-07-06 NOTE — Progress Notes (Signed)
Echocardiogram 2D Echocardiogram has been performed.  Warren Lacy Aman Bonet RDCS 07/06/2021, 3:15 PM  Dr. Duke Salvia notified of stat
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
## 2021-07-06 NOTE — ED Notes (Signed)
PHONE HANDOFF REPORT PROVIDED TO [facility] °

## 2021-07-07 LAB — CBC
HCT: 37.9 % (ref 36.0–46.0)
HCT: 40.7 % (ref 36.0–46.0)
Hemoglobin: 12.4 g/dL (ref 12.0–15.0)
Hemoglobin: 13.5 g/dL (ref 12.0–15.0)
MCH: 28.2 pg (ref 26.0–34.0)
MCH: 28.2 pg (ref 26.0–34.0)
MCHC: 32.7 g/dL (ref 30.0–36.0)
MCHC: 33.2 g/dL (ref 30.0–36.0)
MCV: 86.3 fL (ref 80.0–100.0)
MCV: 85 fL (ref 80.0–100.0)
Platelets: 234 10*3/uL (ref 150–400)
Platelets: 253 10*3/uL (ref 150–400)
RBC: 4.39 MIL/uL (ref 3.87–5.11)
RBC: 4.79 MIL/uL (ref 3.87–5.11)
RDW: 12.4 % (ref 11.5–15.5)
RDW: 12.3 % (ref 11.5–15.5)
WBC: 7.3 10*3/uL (ref 4.0–10.5)
WBC: 7.5 10*3/uL (ref 4.0–10.5)
nRBC: 0 % (ref 0.0–0.2)
nRBC: 0 % (ref 0.0–0.2)

## 2021-07-07 LAB — COMPREHENSIVE METABOLIC PANEL
ALT: 45 U/L — ABNORMAL HIGH (ref 0–44)
AST: 43 U/L — ABNORMAL HIGH (ref 15–41)
Albumin: 3.4 g/dL — ABNORMAL LOW (ref 3.5–5.0)
Alkaline Phosphatase: 70 U/L (ref 38–126)
Anion gap: 6 (ref 5–15)
BUN: 6 mg/dL (ref 6–20)
CO2: 22 mmol/L (ref 22–32)
Calcium: 8.9 mg/dL (ref 8.9–10.3)
Chloride: 109 mmol/L (ref 98–111)
Creatinine, Ser: 0.57 mg/dL (ref 0.44–1.00)
GFR, Estimated: >60 mL/min (ref >60)
Glucose, Bld: 86 mg/dL (ref 70–99)
Potassium: 4 mmol/L (ref 3.5–5.1)
Sodium: 137 mmol/L (ref 135–145)
Total Bilirubin: 1.2 mg/dL (ref 0.3–1.2)
Total Protein: 6.1 g/dL — ABNORMAL LOW (ref 6.5–8.1)

## 2021-07-07 MED ORDER — SODIUM CHLORIDE 0.9% FLUSH: 3.0000 mL | INTRAVENOUS | Status: DC | PRN

## 2021-07-07 MED ORDER — METHOCARBAMOL 500 MG PO TABS
500.0000 mg | ORAL_TABLET | Freq: Three times a day (TID) | ORAL | Status: DC
  Administered 2021-07-07: 500 mg via ORAL
  Filled 2021-07-07 (×2): qty 1

## 2021-07-07 MED ORDER — CHLORHEXIDINE GLUCONATE CLOTH 2 % EX PADS: 6.0000 | MEDICATED_PAD | Freq: Every day | CUTANEOUS | Status: DC

## 2021-07-07 MED ORDER — ACETAMINOPHEN 325 MG PO TABS: 650.0000 mg | ORAL_TABLET | Freq: Four times a day (QID) | ORAL | Status: DC | PRN

## 2021-07-07 MED ORDER — METHOCARBAMOL 500 MG PO TABS: 500.0000 mg | ORAL_TABLET | Freq: Three times a day (TID) | ORAL | 0 refills | Status: AC | PRN

## 2021-07-07 MED ORDER — SODIUM CHLORIDE 0.9% FLUSH
3.0000 mL | Freq: Two times a day (BID) | INTRAVENOUS | Status: DC
  Administered 2021-07-07: 3 mL via INTRAVENOUS

## 2021-07-07 MED ORDER — MORPHINE SULFATE (PF) 2 MG/ML IV SOLN
2.0000 mg | INTRAVENOUS | Status: DC | PRN
Start: 2021-07-07 — End: 2021-07-07

## 2021-07-07 MED ORDER — DOCUSATE SODIUM 100 MG PO CAPS: 100.0000 mg | ORAL_CAPSULE | Freq: Every day | ORAL | Status: AC | PRN

## 2021-07-07 MED ORDER — OXYCODONE HCL 5 MG PO TABS
5.0000 mg | ORAL_TABLET | Freq: Four times a day (QID) | ORAL | Status: DC | PRN
Start: 2021-07-07 — End: 2021-07-07
  Administered 2021-07-07: 5 mg via ORAL
  Filled 2021-07-07: qty 1

## 2021-07-07 MED ORDER — SODIUM CHLORIDE 0.9 % IV SOLN: 250.0000 mL | INTRAVENOUS | Status: DC | PRN

## 2021-07-07 MED ORDER — DOCUSATE SODIUM 100 MG PO CAPS
100.0000 mg | ORAL_CAPSULE | Freq: Two times a day (BID) | ORAL | Status: DC
  Administered 2021-07-07: 100 mg via ORAL
  Filled 2021-07-07: qty 1

## 2021-07-07 NOTE — TOC CAGE-AID Note (Signed)
Transition of Care [facility]) - CAGE-AID Screening   Patient Details  Name: AHLANI WICKES MRN: [id number] Date of Birth: [birth year]  [facility]) CM/SW Contact:    Lossie Faes Tarpley-Carter, LCSWA Phone Number: 07/07/2021, 1:26 PM   Clinical Narrative: Pt participated in Cage-Aid.  Pt stated she does not use substance or ETOH.  Pt was not offered resources, due to no usage of substance or ETOH.     Juwana Thoreson Tarpley-Carter, MSW, LCSW-A Pronouns:  She/Her/Hers [facility] Clinical Social Worker Direct Number:  [phone] [email or web address]  CAGE-AID Screening:    Have You Ever Felt You Ought to Cut Down on Your Drinking or Drug Use?: No Have People Annoyed You By [employer] Your Drinking Or Drug Use?: No Have You Felt Bad Or Guilty About Your Drinking Or Drug Use?: No Have You Ever Had a Drink or Used Drugs First Thing In The Morning to Steady Your Nerves or to Get Rid of a Hangover?: No CAGE-AID Score: 0  Substance Abuse Education Offered: No

## 2021-07-07 NOTE — Progress Notes (Signed)
All patient belongings with patient at bedside. Discussed discharge instructions and addressed all questions with discharge paperwork. Patient to be picked up via private vehicle from hospital .   PIV x2 removed without complications.
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
## 2021-07-17 ENCOUNTER — Emergency Department (HOSPITAL_COMMUNITY)
Admission: EM | Admit: 2021-07-17 | Discharge: 2021-07-17 | Disposition: A | Discharge status: Home / Self Care | Payer: Medicaid Other | Attending: Emergency Medicine | Admitting: Emergency Medicine

## 2021-07-17 ENCOUNTER — Encounter (HOSPITAL_COMMUNITY): Payer: Self-pay

## 2021-07-17 ENCOUNTER — Other Ambulatory Visit: Payer: Self-pay

## 2021-07-17 DIAGNOSIS — Z91040 Latex allergy status: Secondary | ICD-10-CM | POA: Insufficient documentation

## 2021-07-17 DIAGNOSIS — S139XXD Sprain of joints and ligaments of unspecified parts of neck, subsequent encounter: Secondary | ICD-10-CM | POA: Diagnosis not present

## 2021-07-17 DIAGNOSIS — S161XXD Strain of muscle, fascia and tendon at neck level, subsequent encounter: Secondary | ICD-10-CM
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
## 2021-07-17 NOTE — Discharge Instructions (Addendum)
Please continue anti-inflammatories and do gentle stretching.  Return to the emergency department if any worsening or concerning symptoms

## 2021-10-19 ENCOUNTER — Emergency Department (HOSPITAL_COMMUNITY)
Admission: EM | Admit: 2021-10-19 | Discharge: 2021-10-19 | Disposition: A | Discharge status: Home / Self Care | Payer: Medicaid Other | Attending: Emergency Medicine | Admitting: Emergency Medicine

## 2021-10-19 ENCOUNTER — Encounter (HOSPITAL_COMMUNITY): Payer: Self-pay | Admitting: *Deleted

## 2021-10-19 DIAGNOSIS — Z20822 Contact with and (suspected) exposure to covid-19: Secondary | ICD-10-CM | POA: Diagnosis not present

## 2021-10-19 DIAGNOSIS — Z91040 Latex allergy status: Secondary | ICD-10-CM | POA: Diagnosis not present

## 2021-10-19 DIAGNOSIS — R000 Tachycardia, unspecified: Secondary | ICD-10-CM | POA: Diagnosis not present

## 2021-10-19 DIAGNOSIS — R059 Cough, unspecified: Secondary | ICD-10-CM | POA: Diagnosis present

## 2021-10-19 DIAGNOSIS — J101 Influenza due to other identified influenza virus with other respiratory manifestations: Secondary | ICD-10-CM | POA: Diagnosis not present

## 2021-10-19 LAB — RESP PANEL BY RT-PCR (FLU A&B, COVID) ARPGX2
Influenza A by PCR: POSITIVE — AB
Influenza B by PCR: NEGATIVE
SARS Coronavirus 2 by RT PCR: NEGATIVE

## 2021-10-19 MED ORDER — BENZONATATE 100 MG PO CAPS: 100.0000 mg | ORAL_CAPSULE | Freq: Three times a day (TID) | ORAL | 0 refills | Status: AC

## 2021-10-19 NOTE — ED Notes (Signed)
Dc instructions and scripts reviewed with pt no questions or concerns at this time.  

## 2021-10-19 NOTE — Discharge Instructions (Addendum)
Please refer to the attached instructions 
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
## 2021-10-19 NOTE — ED Triage Notes (Signed)
Productive cough with fever
